# Patient Record
Sex: Male | Born: 1990 | Race: Black or African American | Hispanic: No | Marital: Single | Smoking: Former smoker
Health system: Southern US, Community
[De-identification: ages and names within clinical notes are randomized; demographics above are authoritative.]

## PROBLEM LIST (undated history)

## (undated) DIAGNOSIS — L309 Dermatitis, unspecified: Secondary | ICD-10-CM

## (undated) DIAGNOSIS — J45909 Unspecified asthma, uncomplicated: Secondary | ICD-10-CM

## (undated) HISTORY — DX: Dermatitis, unspecified: L30.9

## (undated) HISTORY — PX: FINGER SURGERY: SHX640

---

## 2010-08-22 ENCOUNTER — Emergency Department (HOSPITAL_COMMUNITY)
Admission: EM | Admit: 2010-08-22 | Discharge: 2010-08-23 | Payer: Self-pay | Source: Home / Self Care | Admitting: Emergency Medicine

## 2010-08-23 ENCOUNTER — Emergency Department (HOSPITAL_COMMUNITY)
Admission: EM | Admit: 2010-08-23 | Discharge: 2010-08-24 | Payer: Self-pay | Source: Home / Self Care | Admitting: Emergency Medicine

## 2011-08-21 ENCOUNTER — Emergency Department (HOSPITAL_COMMUNITY)
Admission: EM | Admit: 2011-08-21 | Discharge: 2011-08-21 | Disposition: A | Discharge status: Home / Self Care | Payer: Self-pay | Attending: Emergency Medicine | Admitting: Emergency Medicine

## 2011-08-21 DIAGNOSIS — Z0389 Encounter for observation for other suspected diseases and conditions ruled out: Secondary | ICD-10-CM | POA: Insufficient documentation

## 2011-08-22 ENCOUNTER — Emergency Department (HOSPITAL_COMMUNITY): Payer: Self-pay

## 2011-08-22 ENCOUNTER — Emergency Department (HOSPITAL_COMMUNITY)
Admission: EM | Admit: 2011-08-22 | Discharge: 2011-08-22 | Disposition: A | Discharge status: Home / Self Care | Payer: Self-pay | Attending: Emergency Medicine | Admitting: Emergency Medicine

## 2011-08-22 DIAGNOSIS — IMO0002 Reserved for concepts with insufficient information to code with codable children: Secondary | ICD-10-CM | POA: Insufficient documentation

## 2011-08-22 DIAGNOSIS — S91109A Unspecified open wound of unspecified toe(s) without damage to nail, initial encounter: Secondary | ICD-10-CM | POA: Insufficient documentation

## 2011-08-22 DIAGNOSIS — J45909 Unspecified asthma, uncomplicated: Secondary | ICD-10-CM | POA: Insufficient documentation

## 2011-08-22 DIAGNOSIS — M79609 Pain in unspecified limb: Secondary | ICD-10-CM | POA: Insufficient documentation

## 2011-08-30 ENCOUNTER — Emergency Department (HOSPITAL_COMMUNITY)
Admission: EM | Admit: 2011-08-30 | Discharge: 2011-08-30 | Disposition: A | Discharge status: Home / Self Care | Payer: Self-pay | Attending: Emergency Medicine | Admitting: Emergency Medicine

## 2011-08-30 DIAGNOSIS — S91109A Unspecified open wound of unspecified toe(s) without damage to nail, initial encounter: Secondary | ICD-10-CM | POA: Insufficient documentation

## 2011-08-30 DIAGNOSIS — J45909 Unspecified asthma, uncomplicated: Secondary | ICD-10-CM | POA: Insufficient documentation

## 2011-08-30 DIAGNOSIS — M7989 Other specified soft tissue disorders: Secondary | ICD-10-CM | POA: Insufficient documentation

## 2011-08-30 DIAGNOSIS — Y849 Medical procedure, unspecified as the cause of abnormal reaction of the patient, or of later complication, without mention of misadventure at the time of the procedure: Secondary | ICD-10-CM | POA: Insufficient documentation

## 2011-08-30 DIAGNOSIS — T8131XA Disruption of external operation (surgical) wound, not elsewhere classified, initial encounter: Secondary | ICD-10-CM | POA: Insufficient documentation

## 2011-08-30 DIAGNOSIS — Z4802 Encounter for removal of sutures: Secondary | ICD-10-CM | POA: Insufficient documentation

## 2011-08-30 DIAGNOSIS — IMO0002 Reserved for concepts with insufficient information to code with codable children: Secondary | ICD-10-CM | POA: Insufficient documentation

## 2013-02-18 ENCOUNTER — Emergency Department (HOSPITAL_COMMUNITY)
Admission: EM | Admit: 2013-02-18 | Discharge: 2013-02-18 | Disposition: A | Discharge status: Home / Self Care | Payer: Self-pay | Attending: Emergency Medicine | Admitting: Emergency Medicine

## 2013-02-18 ENCOUNTER — Emergency Department (HOSPITAL_COMMUNITY): Payer: Self-pay

## 2013-02-18 ENCOUNTER — Encounter (HOSPITAL_COMMUNITY): Payer: Self-pay | Admitting: Nurse Practitioner

## 2013-02-18 DIAGNOSIS — R0602 Shortness of breath: Secondary | ICD-10-CM | POA: Insufficient documentation

## 2013-02-18 DIAGNOSIS — R0989 Other specified symptoms and signs involving the circulatory and respiratory systems: Secondary | ICD-10-CM | POA: Insufficient documentation

## 2013-02-18 DIAGNOSIS — Z79899 Other long term (current) drug therapy: Secondary | ICD-10-CM | POA: Insufficient documentation

## 2013-02-18 DIAGNOSIS — F17200 Nicotine dependence, unspecified, uncomplicated: Secondary | ICD-10-CM | POA: Insufficient documentation

## 2013-02-18 DIAGNOSIS — J45901 Unspecified asthma with (acute) exacerbation: Secondary | ICD-10-CM | POA: Insufficient documentation

## 2013-02-18 DIAGNOSIS — R0609 Other forms of dyspnea: Secondary | ICD-10-CM | POA: Insufficient documentation

## 2013-02-18 HISTORY — DX: Unspecified asthma, uncomplicated: J45.909

## 2013-02-18 LAB — BASIC METABOLIC PANEL
CO2: 26 mEq/L (ref 19–32)
GFR calc non Af Amer: >90 mL/min (ref >90)
Glucose, Bld: 72 mg/dL (ref 70–99)
Potassium: 3.5 mEq/L (ref 3.5–5.1)
Sodium: 139 mEq/L (ref 135–145)

## 2013-02-18 LAB — CBC
Hemoglobin: 14.3 g/dL (ref 13.0–17.0)
RBC: 4.88 MIL/uL (ref 4.22–5.81)

## 2013-02-18 LAB — POCT I-STAT TROPONIN I

## 2013-02-18 MED ORDER — ALBUTEROL SULFATE HFA 108 (90 BASE) MCG/ACT IN AERS
2.0000 | INHALATION_SPRAY | Freq: Once | RESPIRATORY_TRACT | Status: AC
  Administered 2013-02-18: 2 via RESPIRATORY_TRACT
  Filled 2013-02-18: qty 6.7

## 2013-02-18 MED ORDER — PREDNISONE 20 MG PO TABS: 40.0000 mg | ORAL_TABLET | Freq: Every day | ORAL | Status: DC

## 2013-02-18 NOTE — ED Notes (Signed)
Pt presents to department for evaluation of chest pain. States ongoing x2 weeks. Also states asthma issues and upper respiratory cold with yellow mucus. States he has been wheezing at home. Denies pain at the time. Lung sounds clear and equal bilaterally. Respirations unlabored. Speaking complete sentences. Pt is alert and oriented x4. Skin warm and dry.

## 2013-02-18 NOTE — ED Provider Notes (Signed)
History     CSN: [id number]  Arrival date & time 02/18/13  1238   First MD Initiated Contact with Patient 02/18/13 1425      Chief Complaint  Patient presents with  . Chest Pain    (Consider location/radiation/quality/duration/timing/severity/associated sxs/prior treatment) HPI Comments: 22-year-old male with a history of asthma who presents for chest tightness that has been coming more frequent over the last 2 weeks. Patient states episodes of chest tightness last a few minutes before spontaneously resolving. Patient states symptoms are worse with activity/movement and relieved with rest. Patient denies fever, cough, chest pain, syncope, N/V/D, abdominal pain, weakness, and numbness or tingling in his extremities.  The history is provided by the patient. No language interpreter was used.    Past Medical History  Diagnosis Date  . Asthma     No past surgical history on file.  History reviewed. No pertinent family history.  History  Substance Use Topics  . Smoking status: Current Every Day Smoker  . Smokeless tobacco: Not on file  . Alcohol Use: No      Review of Systems  Constitutional: Negative for fever.  Eyes: Negative for visual disturbance.  Respiratory: Positive for chest tightness and shortness of breath. Negative for cough.   Cardiovascular: Negative for chest pain and palpitations.  Gastrointestinal: Negative for nausea, vomiting, abdominal pain and diarrhea.  Neurological: Negative for syncope, weakness and numbness.  All other systems reviewed and are negative.    Allergies  Review of patient's allergies indicates no known allergies.  Home Medications   Current Outpatient Rx  Name  Route  Sig  Dispense  Refill  . diphenhydrAMINE (BENADRYL) 25 mg capsule   Oral   Take 25 mg by mouth every 6 (six) hours as needed for itching.         . fexofenadine (ALLEGRA) 180 MG tablet   Oral   Take 180 mg by mouth daily.         . Multiple Vitamin  (MULTIVITAMIN WITH MINERALS) TABS   Oral   Take 1 tablet by mouth daily.         . Pseudoephedrine-Acetaminophen (ALKA-SELTZER PLUS COLD/SINUS PO)   Oral   Take 2 capsules by mouth every 4 (four) hours as needed (for cold symptoms).         . predniSONE (DELTASONE) 20 MG tablet   Oral   Take 2 tablets (40 mg total) by mouth daily.   10 tablet   0     BP 121/56  Pulse 53  Temp(Src) 98.4 F (36.9 C) (Oral)  Resp 19  SpO2 97%  Physical Exam  Nursing note and vitals reviewed. Constitutional: He is oriented to person, place, and time. He appears well-developed and well-nourished. No distress.  HENT:  Head: Normocephalic and atraumatic.  Mouth/Throat: Oropharynx is clear and moist. No oropharyngeal exudate.  Eyes: Conjunctivae and EOM are normal. Pupils are equal, round, and reactive to light. No scleral icterus.  Neck: Normal range of motion. Neck supple.  Cardiovascular: Normal rate, regular rhythm, normal heart sounds and intact distal pulses.   Pulmonary/Chest: Effort normal and breath sounds normal. No respiratory distress. He has no wheezes. He has no rales.  Abdominal: Soft. He exhibits no distension. There is no tenderness.  Musculoskeletal: Normal range of motion.  Neurological: He is alert and oriented to person, place, and time.  Skin: Skin is warm and dry. No rash noted. He is not diaphoretic. No erythema. No pallor.  Psychiatric: He has a  normal mood and affect. His behavior is normal.    ED Course  Procedures (including critical care time)  Labs Reviewed  CBC  BASIC METABOLIC PANEL  POCT I-STAT TROPONIN I   Dg Chest 2 View  02/18/2013  *RADIOLOGY REPORT*  Clinical Data: Chest pain and cough; wheezing  CHEST - 2 VIEW  Comparison: None.  Findings:  The lungs are clear.  The heart size and pulmonary vascularity are normal.  No adenopathy.  No bone lesions.  No pneumothorax.  IMPRESSION: No abnormality noted.   Original Report Authenticated By: Bretta Bang, M.D.     Date: 02/18/2013  Rate: 65  Rhythm: normal sinus rhythm  QRS Axis: normal  Intervals: normal  ST/T Wave abnormalities: nonspecific ST changes and early repolarization  Conduction Disutrbances:none  Narrative Interpretation: NSR with ST changes consistent with early repolarization; no STEMI  Old EKG Reviewed: none available   1. Asthma exacerbation     MDM  Patient with asthma hx presents for chest tightness and dyspnea that lasts a few minutes before spontaneously resolving; episodes are increasing in frequency and improved with rest. On exam, heart RRR, lungs CTAB, and abdomen without TTP or peritoneal signs; patient well and nontoxic appearing and in no acute distress. He is not hypoxic, tachycardic or tachypneic. Patient states he has not required tx for asthma in "years". Have expressed that symptoms consistent with asthma exacerbation. Labs without significant findings and EKG without STEMI or T wave inversion. Patient stable for d/c with PCP follow up as well as 5 day course of prednisone and albuterol inhaler. Indications for ED return discussed. Patient states comfort and understanding with this d/c plan with no unaddressed concerns. Patient work up and management discussed with Dr. Adriana Simas who is in agreement.        Antony Madura, PA-C 02/19/13 [phone]

## 2013-02-18 NOTE — ED Notes (Signed)
C/o cp for 2 weeks, "feels like my chest is going to explode every time I breathe." reports history of asthma many years ago "but this feels different." has no needed inhalers for many years. A&Ox4, resp e/u

## 2013-02-18 NOTE — ED Notes (Signed)
Lab called, to re-draw ISTAT troponin, PA aware. Vital signs stable. No signs of acute distress noted. Friend at the bedside.

## 2013-02-20 NOTE — ED Provider Notes (Signed)
Medical screening examination/treatment/procedure(s) were conducted as a shared visit with non-physician practitioner(s) and myself.  I personally evaluated the patient during the encounter.  Asthma exacerbation.  Good oxygenation. Home with outpatient management  Donnetta Hutching, MD 02/20/13 1004

## 2013-06-16 ENCOUNTER — Emergency Department (HOSPITAL_COMMUNITY)
Admission: EM | Admit: 2013-06-16 | Discharge: 2013-06-16 | Disposition: A | Discharge status: Home / Self Care | Payer: Self-pay | Attending: Emergency Medicine | Admitting: Emergency Medicine

## 2013-06-16 ENCOUNTER — Encounter (HOSPITAL_COMMUNITY): Payer: Self-pay | Admitting: *Deleted

## 2013-06-16 DIAGNOSIS — J45901 Unspecified asthma with (acute) exacerbation: Secondary | ICD-10-CM | POA: Insufficient documentation

## 2013-06-16 DIAGNOSIS — F17200 Nicotine dependence, unspecified, uncomplicated: Secondary | ICD-10-CM | POA: Insufficient documentation

## 2013-06-16 MED ORDER — IPRATROPIUM BROMIDE 0.02 % IN SOLN
0.5000 mg | Freq: Once | RESPIRATORY_TRACT | Status: AC
  Administered 2013-06-16: 0.5 mg via RESPIRATORY_TRACT
  Filled 2013-06-16: qty 2.5

## 2013-06-16 MED ORDER — ALBUTEROL SULFATE (5 MG/ML) 0.5% IN NEBU
5.0000 mg | INHALATION_SOLUTION | Freq: Once | RESPIRATORY_TRACT | Status: AC
  Administered 2013-06-16: 5 mg via RESPIRATORY_TRACT

## 2013-06-16 MED ORDER — PREDNISONE 20 MG PO TABS: 40.0000 mg | ORAL_TABLET | Freq: Every day | ORAL | Status: DC

## 2013-06-16 MED ORDER — ALBUTEROL SULFATE (5 MG/ML) 0.5% IN NEBU
5.0000 mg | INHALATION_SOLUTION | Freq: Once | RESPIRATORY_TRACT | Status: AC
  Administered 2013-06-16: 5 mg via RESPIRATORY_TRACT
  Filled 2013-06-16: qty 1

## 2013-06-16 MED ORDER — PREDNISONE 20 MG PO TABS
60.0000 mg | ORAL_TABLET | Freq: Once | ORAL | Status: AC
  Administered 2013-06-16: 60 mg via ORAL
  Filled 2013-06-16: qty 3

## 2013-06-16 MED ORDER — IPRATROPIUM BROMIDE 0.02 % IN SOLN
0.5000 mg | Freq: Once | RESPIRATORY_TRACT | Status: AC
  Administered 2013-06-16: 0.5 mg via RESPIRATORY_TRACT

## 2013-06-16 MED ORDER — ALBUTEROL SULFATE (5 MG/ML) 0.5% IN NEBU
INHALATION_SOLUTION | RESPIRATORY_TRACT | Status: AC
  Filled 2013-06-16: qty 1

## 2013-06-16 MED ORDER — ALBUTEROL SULFATE HFA 108 (90 BASE) MCG/ACT IN AERS
2.0000 | INHALATION_SPRAY | RESPIRATORY_TRACT | Status: DC | PRN
  Administered 2013-06-16: 2 via RESPIRATORY_TRACT
  Filled 2013-06-16 (×2): qty 6.7

## 2013-06-16 NOTE — ED Provider Notes (Signed)
CSN: [id number]     Arrival date & time 06/16/13  1558 History     First MD Initiated Contact with Patient 06/16/13 1621     Chief Complaint  Patient presents with  . Asthma   (Consider location/radiation/quality/duration/timing/severity/associated sxs/prior Treatment) Patient is a 22 y.o. male presenting with asthma.  Asthma Associated symptoms include coughing. Pertinent negatives include no abdominal pain, chest pain, chills, congestion, diaphoresis, fatigue, fever, headaches, nausea, sore throat, vomiting or weakness.   Davione Lenker is a 22 year old male with PMH of Asthma who presents today for SOB.  He reports that he was playing basketball yesterday with friend and afterworld he felt chest tightness and felt SOB.  This has continued until today, he does not have a PCP at this time and does not have an albuterol rescue inhaler. When his shortness of breath still did not improve he decided to come to the ED for treatment. He reports that he was diagnosed with Asthma as a child.  He reports multiple ER visits and treatment for asthma exacerbations as a child but only one admission when he was 22 years old.  He reports that he has never required intubation.  As he got older he figured he was no longer asthmatic however in April of this year he had a mild asthma exacerbation for which he was treated in the Emergency department and given a 5 day course of prednisone.  He reports since that time he has by symptom free but has not yet established with a PCP.  He reports that he recently started a job and is waiting for insurance card to come to establish with a new PCP.   Past Medical History  Diagnosis Date  . Asthma    History reviewed. No pertinent past surgical history. No family history on file. History  Substance Use Topics  . Smoking status: Current Every Day Smoker  . Smokeless tobacco: Not on file  . Alcohol Use: No    Review of Systems  Constitutional: Negative for fever,  chills, diaphoresis, activity change, appetite change and fatigue.  HENT: Negative for congestion, sore throat, rhinorrhea, sneezing, postnasal drip and sinus pressure.   Eyes: Negative for visual disturbance.  Respiratory: Positive for cough, chest tightness, shortness of breath and wheezing. Negative for stridor.   Cardiovascular: Negative for chest pain, palpitations and leg swelling.  Gastrointestinal: Negative for nausea, vomiting, abdominal pain, diarrhea, constipation and abdominal distention.  Endocrine: Negative for polydipsia and polyuria.  Genitourinary: Negative for dysuria, frequency and difficulty urinating.  Skin: Negative for wound.  Allergic/Immunologic: Negative for food allergies.  Neurological: Negative for dizziness, weakness and headaches.  Psychiatric/Behavioral: Negative for confusion.    Allergies  Other  Home Medications   Current Outpatient Rx  Name  Route  Sig  Dispense  Refill  . diphenhydrAMINE (BENADRYL) 25 mg capsule   Oral   Take 25 mg by mouth every 6 (six) hours as needed for itching.         . fexofenadine (ALLEGRA) 180 MG tablet   Oral   Take 180 mg by mouth daily.         . Multiple Vitamin (MULTIVITAMIN WITH MINERALS) TABS   Oral   Take 1 tablet by mouth daily.         . predniSONE (DELTASONE) 20 MG tablet   Oral   Take 2 tablets (40 mg total) by mouth daily.   10 tablet   0   . Pseudoephedrine-Acetaminophen (ALKA-SELTZER PLUS COLD/SINUS  PO)   Oral   Take 2 capsules by mouth every 4 (four) hours as needed (for cold symptoms).          BP 138/90  Pulse 79  Temp(Src) 98.1 F (36.7 C) (Oral)  Resp 20  SpO2 97% Physical Exam  Nursing note and vitals reviewed. Constitutional: He is oriented to person, place, and time. He appears well-developed and well-nourished. He appears distressed (mild).  HENT:  Head: Normocephalic and atraumatic.  Eyes: Conjunctivae and EOM are normal.  Cardiovascular: Normal rate, regular rhythm  and normal heart sounds.   No murmur heard. Pulmonary/Chest: He has wheezes.  No accessory muscle use, evaluated after first dose of albuterol.  Abdominal: Soft. Bowel sounds are normal. There is no tenderness.  Neurological: He is alert and oriented to person, place, and time.  Skin: Skin is warm. He is not diaphoretic.  Psychiatric: He has a normal mood and affect. His behavior is normal. Judgment normal.    ED Course   Procedures (including critical care time)  Labs Reviewed - No data to display No results found. 1. Mild asthma exacerbation     MDM  22 year old male with PMH of Asthma who presents for a mild asthma exacerbation.  He is speaking in full sentences, his repiratory rate in mildly increased, no accessory muscle use, and is HR is less than 100.  He has been given one dose of nebulizer albuterol with some improvement, however he is still wheezing.  Will give a second neb treatment with atrovent, patient still had wheezing but feels better.  A third albuterol and atrovent treatment was given.  Patient reported feeling much better.  Mild wheezing was still heard on exam but patient was ambulated with O2 sat remained at 96% with HR from 100-115. Albuterol rescue inhaler ordered to bedside and patient instructed on use.  Patient was also given a peak flow meter with instructions for use.   Patient will be discharged with a 5 day course of prednisone and with albuterol inhaler.  Importance of establishing with PCP was reiterated and patient was given local resource guide.  Carlynn Purl, DO 06/16/13 [phone]

## 2013-06-16 NOTE — Progress Notes (Signed)
Peak flows 350 post tx's. MDI and spacer given to patient with peak flow, pt demonstrates knowledge of use and has no questions for RT at this time. RT will continue to monitor.

## 2013-06-16 NOTE — ED Provider Notes (Signed)
I saw and evaluated the patient, reviewed the resident's note and I agree with the findings and plan. Pt with typical asthma exacerbation  symptoms.  No infectious sx, productive cough or other complaints.  Wheezing on exam.  will give steroids, albuterol/atrovent and recheck. Pt improved with therapy but still some wheezing.  Normal O2 sats.  Gwyneth Sprout, MD 06/16/13 [phone]

## 2013-06-16 NOTE — ED Notes (Signed)
Pt discharged.Vital signs stable and GCS 15 

## 2013-06-16 NOTE — ED Notes (Signed)
Pt has wheezing and suffers from asthma

## 2014-02-03 ENCOUNTER — Emergency Department (HOSPITAL_COMMUNITY): Payer: BC Managed Care – PPO

## 2014-02-03 ENCOUNTER — Emergency Department (HOSPITAL_COMMUNITY)
Admission: EM | Admit: 2014-02-03 | Discharge: 2014-02-03 | Disposition: A | Discharge status: Home / Self Care | Payer: BC Managed Care – PPO | Attending: Emergency Medicine | Admitting: Emergency Medicine

## 2014-02-03 DIAGNOSIS — J45901 Unspecified asthma with (acute) exacerbation: Secondary | ICD-10-CM

## 2014-02-03 DIAGNOSIS — F17200 Nicotine dependence, unspecified, uncomplicated: Secondary | ICD-10-CM | POA: Insufficient documentation

## 2014-02-03 DIAGNOSIS — IMO0002 Reserved for concepts with insufficient information to code with codable children: Secondary | ICD-10-CM | POA: Insufficient documentation

## 2014-02-03 DIAGNOSIS — Z79899 Other long term (current) drug therapy: Secondary | ICD-10-CM | POA: Insufficient documentation

## 2014-02-03 MED ORDER — PREDNISONE 20 MG PO TABS: 40.0000 mg | ORAL_TABLET | Freq: Every day | ORAL | Status: DC

## 2014-02-03 MED ORDER — ALBUTEROL SULFATE HFA 108 (90 BASE) MCG/ACT IN AERS
2.0000 | INHALATION_SPRAY | Freq: Once | RESPIRATORY_TRACT | Status: AC
  Administered 2014-02-03: 2 via RESPIRATORY_TRACT
  Filled 2014-02-03: qty 6.7

## 2014-02-03 MED ORDER — ALBUTEROL SULFATE (2.5 MG/3ML) 0.083% IN NEBU
5.0000 mg | INHALATION_SOLUTION | Freq: Once | RESPIRATORY_TRACT | Status: AC
  Administered 2014-02-03: 5 mg via RESPIRATORY_TRACT
  Filled 2014-02-03: qty 6

## 2014-02-03 MED ORDER — CETIRIZINE HCL 10 MG PO TABS: 10.0000 mg | ORAL_TABLET | Freq: Every day | ORAL | Status: DC

## 2014-02-03 NOTE — Progress Notes (Signed)
[facility] provided pt with a list of primary care resources. Patient stated that he had insurance.

## 2014-02-03 NOTE — ED Notes (Signed)
Pt reports hx of asthma, asthma attack x2 days ago. 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 Pt reports chest pain upon inspiration and coughing. Pain 8/10. Wheezing heard in all lung fields. Pt does not have inhaler at this time.

## 2014-02-03 NOTE — ED Provider Notes (Signed)
CSN: [id number]     Arrival date & time 02/03/14  1415 History   First MD Initiated Contact with Patient 02/03/14 1517     Chief Complaint  Patient presents with  . Asthma     (Consider location/radiation/quality/duration/timing/severity/associated sxs/prior Treatment) HPI  Dory PeruFrank Christensen 23 year old male with a past medical history of asthma and allergies.  The patient presents to the emergency room with chief complaint of asthma exacerbation.  Patient states that his asthma has been improving throughout the years of his life especially with sports activities.  He states occasionally after strenuous gains he will have some wheezing and bronchospasm.  He also states that his asthma tends to get worse around seasonal allergy times.  He should states that over the past few days he's had worsening asthma.  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  He states that he also has a bronchitic type symptoms including pain with cough and some intermittent rhinorrhea.  The patient ran out of his albuterol inhaler at home causing him to come in today for wheezing and shortness of breath.  The patient was evaluated previously by the respiratory therapist he states that he had diffuse inspiratory and expiratory wheezing throughout lung fields.  On my examination the patient had a heart he had 5 mg albuterol neb treatment. Past Medical History  Diagnosis Date  . Asthma    No past surgical history on file. No family history on file. History  Substance Use Topics  . Smoking status: Current Every Day Smoker  . Smokeless tobacco: Not on file  . Alcohol Use: No    Review of Systems  Ten systems reviewed and are negative for acute change, except as noted in the HPI.     Allergies  Other  Home Medications   Current Outpatient Rx  Name  Route  Sig  Dispense  Refill  . albuterol (PROVENTIL HFA;VENTOLIN HFA) 108 (90 BASE) MCG/ACT inhaler   Inhalation  Inhale 1-2 puffs into the lungs every 6 (six) hours as needed for wheezing or shortness of breath.         . cetirizine (ZYRTEC) 10 MG tablet   Oral   Take 10 mg by mouth daily.         . diphenhydrAMINE (BENADRYL) 25 MG tablet   Oral   Take 50 mg by mouth daily as needed for allergies.         . fluticasone (FLOVENT HFA) 110 MCG/ACT inhaler   Inhalation   Inhale 2 puffs into the lungs 2 (two) times daily.          BP 138/81  Pulse 56  Temp(Src) 98.5 F (36.9 C) (Oral)  Resp 16  SpO2 98% Physical Exam Physical Exam  Nursing note and vitals reviewed. Constitutional: He appears well-developed and well-nourished. No distress.  HENT:  Head: Normocephalic and atraumatic.  Eyes: Conjunctivae normal are normal. No scleral icterus.  Neck: Normal range of motion. Neck supple.  Cardiovascular: Normal rate, regular rhythm and normal heart sounds.   Pulmonary/Chest: Patient speaking in full sentences.Effort normal and breath sounds normal. No respiratory distress.  Abdominal: Soft. There is no tenderness.  Musculoskeletal: He exhibits no edema.  Neurological: He is alert.  Skin: Skin is warm and dry. He is not diaphoretic.  Psychiatric: His behavior is normal.    ED Course  Procedures (including critical care time) Labs Review Labs Reviewed  BASIC METABOLIC PANEL  CBC   Imaging Review  Dg Chest 2 View (if Patient Has Fever And/or Copd)  02/03/2014   CLINICAL DATA:  Chest pain and cough  EXAM: CHEST  2 VIEW  COMPARISON:  02/18/2013  FINDINGS: The heart size and mediastinal contours are within normal limits. Both lungs are clear. The visualized skeletal structures are unremarkable.  IMPRESSION: No active cardiopulmonary disease.   Electronically Signed   By: Alcide Clever M.D.   On: 02/03/2014 15:31     EKG Interpretation None      MDM   Final diagnoses:  Asthma exacerbation   cxr negative.I personally reviewed the images using our PACS system.  Patient given  albuterol nebulizer treatment with great improvement.  No no wheezing and auscultation.  Patient speaking in full sentences and states he is feeling much better.  During the patient albuterol rescue inhaler here in the ED.  Start him on a short course of prednisone to decrease his asthma exacerbation recurrence rate.  The patient will also be prescribed Zyrtec 1 daily and referral to primary care through our resource guide.  Safe for discharge at this time.    Arthor Captain, PA-C 02/03/14 1609

## 2014-02-03 NOTE — ED Notes (Signed)
Patient transported to X-ray 

## 2014-02-03 NOTE — Discharge Instructions (Signed)
Asthma, Adult °Asthma is a recurring condition in which the airways tighten and narrow. Asthma can make it difficult to breathe. It can cause coughing, wheezing, and shortness of breath. Asthma episodes (also called asthma attacks) range from minor to life-threatening. Asthma cannot be cured, but medicines and lifestyle changes can help control it. °CAUSES °Asthma is believed to be caused by inherited (genetic) and environmental factors, but its exact cause is unknown. Asthma may be triggered by allergens, lung infections, or irritants in the air. Asthma triggers are different for each person. Common triggers include:  °· Animal dander. °· Dust mites. °· Cockroaches. °· Pollen from trees or grass. °· Mold. °· Smoke. °· Air pollutants such as dust, household cleaners, hair sprays, aerosol sprays, paint fumes, strong chemicals, or strong odors. °· Cold air, weather changes, and winds (which increase molds and pollens in the air). °· Strong emotional expressions such as crying or laughing hard. °· Stress. °· Certain medicines (such as aspirin) or types of drugs (such as beta-blockers). °· Sulfites in foods and drinks. Foods and drinks that may contain sulfites include dried fruit, potato chips, and sparkling grape juice. °· Infections or inflammatory conditions such as the flu, a cold, or an inflammation of the nasal membranes (rhinitis). °· Gastroesophageal reflux disease (GERD). °· Exercise or strenuous activity. °SYMPTOMS °Symptoms may occur immediately after asthma is triggered or many hours later. Symptoms include: °· Wheezing. °· Excessive nighttime or early morning coughing. °· Frequent or severe coughing with a common cold. °· Chest tightness. °· Shortness of breath. °DIAGNOSIS  °The diagnosis of asthma is made by a review of your medical history and a physical exam. Tests may also be performed. These may include: °· Lung function studies. These tests show how much air you breath in and out. °· Allergy  tests. °· Imaging tests such as X-rays. °TREATMENT  °Asthma cannot be cured, but it can usually be controlled. Treatment involves identifying and avoiding your asthma triggers. It also involves medicines. There are 2 classes of medicine used for asthma treatment:  °· Controller medicines. These prevent asthma symptoms from occurring. They are usually taken every day. °· Reliever or rescue medicines. These quickly relieve asthma symptoms. They are used as needed and provide short-term relief. °Your health care provider will help you create an asthma action plan. An asthma action plan is a written plan for managing and treating your asthma attacks. It includes a list of your asthma triggers and how they may be avoided. It also includes information on when medicines should be taken and when their dosage should be changed. An action plan may also involve the use of a device called a peak flow meter. A peak flow meter measures how well the lungs are working. It helps you monitor your condition. °HOME CARE INSTRUCTIONS  °· Take medicine as directed by your health care provider. Speak with your health care provider if you have questions about how or when to take the medicines. °· Use a peak flow meter as directed by your health care provider. Record and keep track of readings. °· Understand and use the action plan to help minimize or stop an asthma attack without needing to seek medical care. °· Control your home environment in the following ways to help prevent asthma attacks: °· Do not smoke. Avoid being exposed to secondhand smoke. °· Change your heating and air conditioning filter regularly. °· Limit your use of fireplaces and wood stoves. °· Get rid of pests (such as roaches and   mice) and their droppings.  Throw away plants if you see mold on them.  Clean your floors and dust regularly. Use unscented cleaning products.  Try to have someone else vacuum for you regularly. Stay out of rooms while they are being  vacuumed and for a short while afterward. If you vacuum, use a dust mask from a hardware store, a double-layered or microfilter vacuum cleaner bag, or a vacuum cleaner with a HEPA filter.  Replace carpet with wood, tile, or vinyl flooring. Carpet can trap dander and dust.  Use allergy-proof pillows, mattress covers, and box spring covers.  Wash bed sheets and blankets every week in hot water and dry them in a dryer.  Use blankets that are made of polyester or cotton.  Clean bathrooms and kitchens with bleach. If possible, have someone repaint the walls in these rooms with mold-resistant paint. Keep out of the rooms that are being cleaned and painted.  Wash hands frequently. SEEK MEDICAL CARE IF:   You have wheezing, shortness of breath, or a cough even if taking medicine to prevent attacks.  The colored mucus you cough up (sputum) is thicker than usual.  Your sputum changes from clear or white to yellow, green, gray, or bloody.  You have any problems that may be related to the medicines you are taking (such as a rash, itching, swelling, or trouble breathing).  You are using a reliever medicine more than 2 3 times per week.  Your peak flow is still at 50 79% of you personal best after following your action plan for 1 hour. SEEK IMMEDIATE MEDICAL CARE IF:   You seem to be getting worse and are unresponsive to treatment during an asthma attack.  You are short of breath even at rest.  You get short of breath when doing very little physical activity.  You have difficulty eating, drinking, or talking due to asthma symptoms.  You develop chest pain.  You develop a fast heartbeat.  You have a bluish color to your lips or fingernails.  You are lightheaded, dizzy, or faint.  Your peak flow is less than 50% of your personal best.  You have a fever or persistent symptoms for more than 2 3 days.  You have a fever and symptoms suddenly get worse. MAKE SURE YOU:   Understand these  instructions.  Will watch your condition.  Will get help right away if you are not doing well or get worse. Document Released: 10/16/2005 Document Revised: 06/18/2013 Document Reviewed: 05/15/2013 [facility] Patient Information 2014 [place]. Smoking Cessation Quitting smoking is important to your health and has many advantages. However, it is not always easy to quit since nicotine is a very addictive drug. Often times, people try 3 times or more before being able to quit. This document explains the best ways for you to prepare to quit smoking. Quitting takes hard work and a lot of effort, but you can do it. ADVANTAGES OF QUITTING SMOKING  You will live longer, feel better, and live better.  Your body will feel the impact of quitting smoking almost immediately.  Within 20 minutes, blood pressure decreases. Your pulse returns to its normal level.  After 8 hours, carbon monoxide levels in the blood return to normal. Your oxygen level increases.  After 24 hours, the chance of having a heart attack starts to decrease. Your breath, hair, and body stop smelling like smoke.  After 48 hours, damaged nerve endings begin to recover. Your sense of taste and smell improve.  After 72 hours, the body is virtually free of nicotine. Your bronchial tubes relax and breathing becomes easier.  After 2 to 12 weeks, lungs can hold more air. Exercise becomes easier and circulation improves.  The risk of having a heart attack, stroke, cancer, or lung disease is greatly reduced.  After 1 year, the risk of coronary heart disease is cut in half.  After 5 years, the risk of stroke falls to the same as a nonsmoker.  After 10 years, the risk of lung cancer is cut in half and the risk of other cancers decreases significantly.  After 15 years, the risk of coronary heart disease drops, usually to the level of a nonsmoker.  If you are pregnant, quitting smoking will improve your chances of having a healthy  baby.  The people you live with, especially any children, will be healthier.  You will have extra money to spend on things other than cigarettes. QUESTIONS TO THINK ABOUT BEFORE ATTEMPTING TO QUIT You may want to talk about your answers with your caregiver.  Why do you want to quit?  If you tried to quit in the past, what helped and what did not?  What will be the most difficult situations for you after you quit? How will you plan to handle them?  Who can help you through the tough times? Your family? Friends? A caregiver?  What pleasures do you get from smoking? What ways can you still get pleasure if you quit? Here are some questions to ask your caregiver:  How can you help me to be successful at quitting?  What medicine do you think would be best for me and how should I take it?  What should I do if I need more help?  What is smoking withdrawal like? How can I get information on withdrawal? GET READY  Set a quit date.  Change your environment by getting rid of all cigarettes, ashtrays, matches, and lighters in your home, car, or work. Do not let people smoke in your home.  Review your past attempts to quit. Think about what worked and what did not. GET SUPPORT AND ENCOURAGEMENT You have a better chance of being successful if you have help. You can get support in many ways.  Tell your family, friends, and co-workers that you are going to quit and need their support. Ask them not to smoke around you.  Get individual, group, or telephone counseling and support. Programs are available at [employer] and health centers. Call your local health department for information about programs in your area.  Spiritual beliefs and practices may help some smokers quit.  Download a "quit meter" on your computer to keep track of quit statistics, such as how long you have gone without smoking, cigarettes not smoked, and money saved.  Get a self-help book about quitting smoking and  staying off of tobacco. LEARN NEW SKILLS AND BEHAVIORS  Distract yourself from urges to smoke. Talk to someone, go for a walk, or occupy your time with a task.  Change your normal routine. Take a different route to work. Drink tea instead of coffee. Eat breakfast in a different place.  Reduce your stress. Take a hot bath, exercise, or read a book.  Plan something enjoyable to do every day. Reward yourself for not smoking.  Explore interactive web-based programs that specialize in helping you quit. GET MEDICINE AND USE IT CORRECTLY Medicines can help you stop smoking and decrease the urge to smoke. Combining medicine with the  above behavioral methods and support can greatly increase your chances of successfully quitting smoking.  Nicotine replacement therapy helps deliver nicotine to your body without the negative effects and risks of smoking. Nicotine replacement therapy includes nicotine gum, lozenges, inhalers, nasal sprays, and skin patches. Some may be available over-the-counter and others require a prescription.  Antidepressant medicine helps people abstain from smoking, but how this works is unknown. This medicine is available by prescription.  Nicotinic receptor partial agonist medicine simulates the effect of nicotine in your brain. This medicine is available by prescription. Ask your caregiver for advice about which medicines to use and how to use them based on your health history. Your caregiver will tell you what side effects to look out for if you choose to be on a medicine or therapy. Carefully read the information on the package. Do not use any other product containing nicotine while using a nicotine replacement product.  RELAPSE OR DIFFICULT SITUATIONS Most relapses occur within the first 3 months after quitting. Do not be discouraged if you start smoking again. Remember, most people try several times before finally quitting. You may have symptoms of withdrawal because your body  is used to nicotine. You may crave cigarettes, be irritable, feel very hungry, cough often, get headaches, or have difficulty concentrating. The withdrawal symptoms are only temporary. They are strongest when you first quit, but they will go away within 10 14 days. To reduce the chances of relapse, try to:  Avoid drinking alcohol. Drinking lowers your chances of successfully quitting.  Reduce the amount of caffeine you consume. Once you quit smoking, the amount of caffeine in your body increases and can give you symptoms, such as a rapid heartbeat, sweating, and anxiety.  Avoid smokers because they can make you want to smoke.  Do not let weight gain distract you. Many smokers will gain weight when they quit, usually less than 10 pounds. Eat a healthy diet and stay active. You can always lose the weight gained after you quit.  Find ways to improve your mood other than smoking. FOR MORE INFORMATION  www.smokefree.gov  Document Released: 10/10/2001 Document Revised: 04/16/2012 Document Reviewed: 01/25/2012 [facility] Patient Information 2014 [place], [place].  Emergency Department Resource Guide 1) Find a Doctor and Pay Out of Pocket Although you won't have to find out who is covered by your insurance plan, it is a good idea to ask around and get recommendations. You will then need to call the office and see if the doctor you have chosen will accept you as a new patient and what types of options they offer for patients who are self-pay. Some doctors offer discounts or will set up payment plans for their patients who do not have insurance, but you will need to ask so you aren't surprised when you get to your appointment.  2) Contact Your Local Health Department Not all health departments have doctors that can see patients for sick visits, but many do, so it is worth a call to see if yours does. If you don't know where your local health department is, you can check in your phone book. The CDC also has  a tool to help you locate your state's health department, and many state websites also have listings of all of their local health departments.  3) Find a Walk-in Clinic If your illness is not likely to be very severe or complicated, you may want to try a walk in clinic. These are popping up all over the country in  pharmacies, drugstores, and shopping centers. They're usually staffed by nurse practitioners or physician assistants that have been trained to treat common illnesses and complaints. They're usually fairly quick and inexpensive. However, if you have serious medical issues or chronic medical problems, these are probably not your best option.  No Primary Care Doctor: - Call Health Connect at  [phone] - they can help you locate a primary care doctor that  accepts your insurance, provides certain services, etc. - Physician Referral Service- [phone]  Chronic Pain Problems: Organization         Address  Phone   Notes  [facility]  [phone] Patients need to be referred by their primary care doctor.   Medication Assistance: Organization         Address  Phone   Notes  [facility] Medication [facility] [address] [place] [place], [place] [address] [phone] --Must be a resident of [facility] -- Must have NO insurance coverage whatsoever (no Medicaid/ Medicare, etc.) -- The pt. MUST have a primary care doctor that directs their care regularly and follows them in the community   [facility]  [phone]   [employer]  [phone]    Agencies that provide inexpensive medical care: Organization         Address  Phone   Notes  [facility]  [phone]   [facility]    [phone]   [facility] [address] [place], [place] [address] [phone]   [facility] [address] [place] [phone]   [facility]    [phone]    [facility]    [phone]   [facility]  [address], [place] Phone:  [phone], Fax:  [phone] Hours of Operation:  9 am - 6 pm, M-F.  Also accepts Medicaid/Medicare and self-pay.  [facility]  [address], [place] Phone: [phone], Fax: [phone]. Hours of Operation:  8:30 am - 5:30 pm, M-F.  Also accepts Medicaid and self-pay.  [facility] [place] [address] [place] Phone: [phone]   [facility] [address] Natasha Bence [place], [place] [phone], Ext. [phone] Mondays & Thursdays: 7-9 AM.  First 15 patients are seen on a first come, first serve basis.    Medicaid-accepting [facility] Providers:  Organization         Address  Phone   Notes  [facility] [address], [place] [phone] Also accepts self-pay patients.  [facility] [address] Laurell Josephs [place], [place]  [phone]   [facility] [address], [place] [phone]   [facility] [address], [place] [phone]   Renaye Rakers [address], [place]   [phone] Only accepts [place] Access [place] patients after they have their name applied to their card.   Self-Pay (no insurance) in [facility]:  Organization         Address  Phone   Notes  Sickle Cell Patients, [facility] [address] [place], [place] [phone]   [facility] [address] [place], [place] [phone]   [facility] [place]  [address], [place] [phone]   [facility]/Dr. Osei-Bonsu  [address], [place] or [address], [place] [phone] Phone number for both [place] and [place] locations is the same.  [facility] [address], [place] [phone]   [facility] [address] [place] or [address] [phone]   [facility] [address] [place] [phone], phone; [phone], fax Sees patients 1st and 3rd Saturday of every month.  Must not qualify for public or private insurance (i.e. Medicaid, Medicare, [place] Health Choice, Veterans' Benefits)  Household income should be no more than 200% of the poverty level The clinic cannot treat you if you are pregnant or think you are pregnant  Sexually transmitted diseases are not treated at the clinic.    Dental Care: Organization         Address  Phone  Notes  [facility] Department of [facility] [address] [place], [place] [phone] Accepts children up to age 71 who are enrolled in [place] or [place] Health Choice; pregnant women with a Medicaid card; and children who have applied for Medicaid or [place] Health Choice, but were declined, whose parents can pay a reduced fee at time of service.  [facility] Department of [facility]  [address], [place] [phone] Accepts children up to age 72 who are enrolled in [place] or [place] Health Choice; pregnant women with a Medicaid card; and children who have applied for Medicaid or [place] Health Choice, but were declined, whose parents can pay a reduced fee at time of service.  [facility]  [address] [place], [place] [phone] Patients are seen by appointment only. Walk-ins are not accepted. [facility] will see patients 65 years of age and older. Monday - Tuesday (8am-5pm) Most Wednesdays (8:30-5pm) $30 per visit, cash only  [facility]  [address], [facility] [phone] Patients are seen by appointment only. Walk-ins are not accepted. [facility] will see patients 31 years of age and older. One Wednesday  Evening (Monthly: Volunteer Based).  $30 per visit, cash only  [employer]  [phone] for adults; Children under age 29, call Graduate Pediatric Dentistry at [phone]. Children aged 69-14, please call [phone] to request a pediatric application.  Dental services are provided in all areas of dental care including fillings, crowns and bridges, complete and partial dentures, implants, gum treatment, root canals, and extractions. Preventive care is also provided. Treatment is provided to both adults and children. Patients are selected via a lottery and there is often a waiting list.   [facility] [address] [place]  [phone] [email or web address]   [facility] [address] [place], [place] [phone], Ext. [phone] Second and Fourth Thursday of each month, opens at 6:30 AM; Clinic ends at 9 AM.  Patients are seen on a first-come first-served basis, and a limited number are seen during each clinic.   [facility]  [address] Ether Griffins [place], [place] [phone]   Eligibility Requirements You must have lived in [place], [place], or [place] counties for at least the last three months.   You cannot be eligible for state or federal sponsored [place], including [employer], [place], or [employer].   You generally cannot be  eligible for healthcare insurance through your employer.    How to apply: Eligibility screenings are held every Tuesday and Wednesday afternoon from 1:00 pm until 4:00 pm. You do not need an appointment for the interview!  [facility] [address] [place], [place] [phone]   [facility] Health Department  [phone]   [facility] Health Department  [phone]   [facility] Health Department  [phone]    Behavioral Health Resources in the Community: Intensive Outpatient Programs Organization         Address  Phone  Notes  [facility] [address] [place], [place] [phone]   [facility] Outpatient [address] [place], [place] [phone]   [facility] [address] [place], [place]  [phone]   [facility] [address],  [place], [place] [phone] or [phone]   Substance Abuse Resources Organization         Address  Phone  Notes  [facility]  [phone]   [facility]  [phone]   [facility] [place]  [phone]   Floydene Flock  [phone]   Residential & Outpatient Substance Abuse Program  [phone]   Psychological Services Organization         Address  Phone  Notes  [facility]  [phone]   [facility]  [phone]   [facility] [address], [place] [phone] or [phone]    Mobile Crisis Teams Organization         Address  Phone  Notes  [facility], Mobile Crisis Care Unit  [phone]   [facility]  [address] [place], [place] [phone]   Doristine Locks [address] [place] [phone]    Self-Help/Support Groups Organization         Address  Phone             Notes  [facility] [place] - variety of support groups  [phone]- [phone] Call for more information  Narcotics Anonymous (NA), [facility] [address], [employer] [place]  2 meetings at this location   Statistician         Address  Phone  Notes  [facility] [address],    [place]  [phone]   [facility]  [address] [place] [address], [place], [place] [phone]   [facility] [address] [place], [place] [phone] Admissions: 8am-3pm M-F  [facility] [address]    [place], [place] [phone]   [facility] [address] [address] [place],  [place], [place] [phone]   [facility] [address],  [place], [place] [phone]   [facility] Intensive [facility]patient [address], [place], [place] [phone]   [facility] [address]  [place], [place] [phone] or [phone]   [facility] [address] [place], [place] [phone] Accepts Medicaid  [facility] [address]  [place] [phone] Substance Abuse/Addiction Treatment   [facility]         Address  Phone  Notes  [facility]  [phone]   Angie Fava, PhD [address] [place], [place]   [phone] or [phone]   [facility]   [address]  [place], [place] [phone]   [facility] [address], [place], [place] [phone] Insurance/Medicaid/sponsorship through [employer] and Families [address] [place], [place] [phone] [place], [place] [phone]    Dr. Adele Schilder  [phone]   [facility] 1) [address]  2) [place] 3)  [address], [place] [phone]  [phone]   [place] [phone] or [phone] (After Hours)

## 2014-02-06 NOTE — ED Provider Notes (Signed)
Medical screening examination/treatment/procedure(s) were performed by non-physician practitioner and as supervising physician I was immediately available for consultation/collaboration.   EKG Interpretation None       Ryleah Miramontes, MD 02/06/14 2006 

## 2014-03-12 ENCOUNTER — Other Ambulatory Visit: Payer: Self-pay | Admitting: Internal Medicine

## 2014-03-13 ENCOUNTER — Other Ambulatory Visit: Payer: Self-pay

## 2014-03-13 ENCOUNTER — Other Ambulatory Visit: Payer: Self-pay | Admitting: Internal Medicine

## 2014-03-13 MED ORDER — ALBUTEROL SULFATE HFA 108 (90 BASE) MCG/ACT IN AERS: 1.0000 | INHALATION_SPRAY | Freq: Four times a day (QID) | RESPIRATORY_TRACT | Status: DC | PRN

## 2014-04-06 ENCOUNTER — Other Ambulatory Visit: Payer: Self-pay | Admitting: Internal Medicine

## 2014-04-07 ENCOUNTER — Other Ambulatory Visit: Payer: BC Managed Care – PPO | Admitting: Internal Medicine

## 2014-04-07 ENCOUNTER — Ambulatory Visit (INDEPENDENT_AMBULATORY_CARE_PROVIDER_SITE_OTHER): Payer: BC Managed Care – PPO | Admitting: Internal Medicine

## 2014-04-07 ENCOUNTER — Encounter: Payer: Self-pay | Admitting: Internal Medicine

## 2014-04-07 VITALS — BP 128/78 | HR 72 | Temp 98.1°F | Ht 72.0 in | Wt 154.0 lb

## 2014-04-07 DIAGNOSIS — Z13220 Encounter for screening for lipoid disorders: Secondary | ICD-10-CM

## 2014-04-07 DIAGNOSIS — J45909 Unspecified asthma, uncomplicated: Secondary | ICD-10-CM

## 2014-04-07 DIAGNOSIS — Z130 Encounter for screening for diseases of the blood and blood-forming organs and certain disorders involving the immune mechanism: Secondary | ICD-10-CM

## 2014-04-07 DIAGNOSIS — J309 Allergic rhinitis, unspecified: Secondary | ICD-10-CM

## 2014-04-07 DIAGNOSIS — Z0000 Encounter for general adult medical examination without abnormal findings: Secondary | ICD-10-CM

## 2014-04-07 LAB — CBC WITH DIFFERENTIAL/PLATELET
BASOS ABS: 0.1 10*3/uL (ref 0.0–0.1)
Basophils Relative: 1 % (ref 0–1)
Eosinophils Absolute: 0.4 10*3/uL (ref 0.0–0.7)
Eosinophils Relative: 6 % — ABNORMAL HIGH (ref 0–5)
HCT: 38.9 % — ABNORMAL LOW (ref 39.0–52.0)
HEMOGLOBIN: 13 g/dL (ref 13.0–17.0)
LYMPHS PCT: 41 % (ref 12–46)
Lymphs Abs: 2.8 10*3/uL (ref 0.7–4.0)
MCH: 28.8 pg (ref 26.0–34.0)
MCHC: 33.4 g/dL (ref 30.0–36.0)
MCV: 86.3 fL (ref 78.0–100.0)
MONOS PCT: 8 % (ref 3–12)
Monocytes Absolute: 0.6 10*3/uL (ref 0.1–1.0)
NEUTROS ABS: 3 10*3/uL (ref 1.7–7.7)
Neutrophils Relative %: 44 % (ref 43–77)
Platelets: 234 10*3/uL (ref 150–400)
RBC: 4.51 MIL/uL (ref 4.22–5.81)
RDW: 13.7 % (ref 11.5–15.5)
WBC: 6.9 10*3/uL (ref 4.0–10.5)

## 2014-04-07 LAB — COMPREHENSIVE METABOLIC PANEL
ALBUMIN: 3.9 g/dL (ref 3.5–5.2)
ALT: 16 U/L (ref 0–53)
AST: 26 U/L (ref 0–37)
Alkaline Phosphatase: 45 U/L (ref 39–117)
BUN: 14 mg/dL (ref 6–23)
CALCIUM: 9.2 mg/dL (ref 8.4–10.5)
CHLORIDE: 107 meq/L (ref 96–112)
CO2: 28 mEq/L (ref 19–32)
Creat: 1.11 mg/dL (ref 0.50–1.35)
GLUCOSE: 78 mg/dL (ref 70–99)
POTASSIUM: 4.5 meq/L (ref 3.5–5.3)
SODIUM: 140 meq/L (ref 135–145)
TOTAL PROTEIN: 5.8 g/dL — AB (ref 6.0–8.3)
Total Bilirubin: 0.7 mg/dL (ref 0.2–1.2)

## 2014-04-07 LAB — LIPID PANEL
Cholesterol: 148 mg/dL (ref 0–200)
HDL: 59 mg/dL (ref >39)
LDL Cholesterol: 80 mg/dL (ref 0–99)
Total CHOL/HDL Ratio: 2.5 Ratio
Triglycerides: 43 mg/dL (ref <150)
VLDL: 9 mg/dL (ref 0–40)

## 2014-04-07 LAB — POCT URINALYSIS DIPSTICK
BILIRUBIN UA: NEGATIVE
Blood, UA: NEGATIVE
Glucose, UA: NEGATIVE
KETONES UA: NEGATIVE
Leukocytes, UA: NEGATIVE
Nitrite, UA: NEGATIVE
PH UA: 8.5
Spec Grav, UA: <=1.005
Urobilinogen, UA: NEGATIVE

## 2014-04-07 MED ORDER — FLUTICASONE PROPIONATE HFA 110 MCG/ACT IN AERO: 2.0000 | INHALATION_SPRAY | Freq: Two times a day (BID) | RESPIRATORY_TRACT | Status: DC

## 2014-04-07 MED ORDER — ALBUTEROL SULFATE HFA 108 (90 BASE) MCG/ACT IN AERS: 1.0000 | INHALATION_SPRAY | Freq: Four times a day (QID) | RESPIRATORY_TRACT | Status: DC | PRN

## 2014-04-07 NOTE — Patient Instructions (Signed)
Use Flovent daily and albuterol as needed. Return in one year.
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
## 2014-06-25 ENCOUNTER — Other Ambulatory Visit: Payer: Self-pay

## 2014-06-25 MED ORDER — FLUTICASONE PROPIONATE HFA 110 MCG/ACT IN AERO: 2.0000 | INHALATION_SPRAY | Freq: Two times a day (BID) | RESPIRATORY_TRACT | Status: DC

## 2014-07-17 ENCOUNTER — Other Ambulatory Visit: Payer: Self-pay

## 2014-07-17 MED ORDER — ALBUTEROL SULFATE HFA 108 (90 BASE) MCG/ACT IN AERS: 1.0000 | INHALATION_SPRAY | Freq: Four times a day (QID) | RESPIRATORY_TRACT | Status: DC | PRN

## 2014-12-07 ENCOUNTER — Other Ambulatory Visit: Payer: Self-pay | Admitting: *Deleted

## 2014-12-07 MED ORDER — ALBUTEROL SULFATE HFA 108 (90 BASE) MCG/ACT IN AERS: 1.0000 | INHALATION_SPRAY | Freq: Four times a day (QID) | RESPIRATORY_TRACT | Status: DC | PRN

## 2015-03-09 ENCOUNTER — Encounter (HOSPITAL_COMMUNITY): Payer: Self-pay | Admitting: Emergency Medicine

## 2015-03-09 ENCOUNTER — Emergency Department (HOSPITAL_COMMUNITY)
Admission: EM | Admit: 2015-03-09 | Discharge: 2015-03-09 | Disposition: A | Discharge status: Home / Self Care | Payer: BLUE CROSS/BLUE SHIELD | Attending: Emergency Medicine | Admitting: Emergency Medicine

## 2015-03-09 DIAGNOSIS — N4889 Other specified disorders of penis: Secondary | ICD-10-CM | POA: Insufficient documentation

## 2015-03-09 DIAGNOSIS — Z720 Tobacco use: Secondary | ICD-10-CM | POA: Insufficient documentation

## 2015-03-09 DIAGNOSIS — Z202 Contact with and (suspected) exposure to infections with a predominantly sexual mode of transmission: Secondary | ICD-10-CM

## 2015-03-09 DIAGNOSIS — R369 Urethral discharge, unspecified: Secondary | ICD-10-CM | POA: Diagnosis not present

## 2015-03-09 DIAGNOSIS — Z7951 Long term (current) use of inhaled steroids: Secondary | ICD-10-CM | POA: Diagnosis not present

## 2015-03-09 DIAGNOSIS — Z79899 Other long term (current) drug therapy: Secondary | ICD-10-CM | POA: Diagnosis not present

## 2015-03-09 DIAGNOSIS — J45909 Unspecified asthma, uncomplicated: Secondary | ICD-10-CM | POA: Insufficient documentation

## 2015-03-09 DIAGNOSIS — Z7952 Long term (current) use of systemic steroids: Secondary | ICD-10-CM | POA: Insufficient documentation

## 2015-03-09 MED ORDER — AZITHROMYCIN 250 MG PO TABS
1000.0000 mg | ORAL_TABLET | Freq: Once | ORAL | Status: AC
  Administered 2015-03-09: 1000 mg via ORAL
  Filled 2015-03-09: qty 4

## 2015-03-09 MED ORDER — CEFTRIAXONE SODIUM 250 MG IJ SOLR
250.0000 mg | Freq: Once | INTRAMUSCULAR | Status: AC
  Administered 2015-03-09: 250 mg via INTRAMUSCULAR
  Filled 2015-03-09: qty 250

## 2015-03-09 MED ORDER — LIDOCAINE HCL 1 % IJ SOLN
INTRAMUSCULAR | Status: AC
  Administered 2015-03-09: 20 mL
  Filled 2015-03-09: qty 20

## 2015-03-09 NOTE — Discharge Instructions (Signed)
You were treated today for both gonorrhea and chlamydia. If these tests result positive, you will be contacted and are then obligated to inform your partner for treatment. °Sexually Transmitted Disease °A sexually transmitted disease (STD) is a disease or infection that may be passed (transmitted) from person to person, usually during sexual activity. This may happen by way of saliva, semen, blood, vaginal mucus, or urine. Common STDs include:  °· Gonorrhea.   °· Chlamydia.   °· Syphilis.   °· HIV and AIDS.   °· Genital herpes.   °· Hepatitis B and C.   °· Trichomonas.   °· Human papillomavirus (HPV).   °· Pubic lice.   °· Scabies. °· Mites. °· Bacterial vaginosis. °WHAT ARE CAUSES OF STDs? °An STD may be caused by bacteria, a virus, or parasites. STDs are often transmitted during sexual activity if one person is infected. However, they may also be transmitted through nonsexual means. STDs may be transmitted after:  °· Sexual intercourse with an infected person.   °· Sharing sex toys with an infected person.   °· Sharing needles with an infected person or using unclean piercing or tattoo needles. °· Having intimate contact with the genitals, mouth, or rectal areas of an infected person.   °· Exposure to infected fluids during birth. °WHAT ARE THE SIGNS AND SYMPTOMS OF STDs? °Different STDs have different symptoms. Some people may not have any symptoms. If symptoms are present, they may include:  °· Painful or bloody urination.   °· Pain in the pelvis, abdomen, vagina, anus, throat, or eyes.   °· A skin rash, itching, or irritation. °· Growths, ulcerations, blisters, or sores in the genital and anal areas. °· Abnormal vaginal discharge with or without bad odor.   °· Penile discharge in men.   °· Fever.   °· Pain or bleeding during sexual intercourse.   °· Swollen glands in the groin area.   °· Yellow skin and eyes (jaundice). This is seen with hepatitis.   °· Swollen testicles. °· Infertility. °· Sores and blisters  in the mouth. °HOW ARE STDs DIAGNOSED? °To make a diagnosis, your health care provider may:  °· Take a medical history.   °· Perform a physical exam.   °· Take a sample of any discharge to examine. °· Swab the throat, cervix, opening to the penis, rectum, or vagina for testing. °· Test a sample of your first morning urine.   °· Perform blood tests.   °· Perform a Pap test, if this applies.   °· Perform a colposcopy.   °· Perform a laparoscopy.   °HOW ARE STDs TREATED? ° Treatment depends on the STD. Some STDs may be treated but not cured.  °· Chlamydia, gonorrhea, trichomonas, and syphilis can be cured with antibiotic medicine.   °· Genital herpes, hepatitis, and HIV can be treated, but not cured, with prescribed medicines. The medicines lessen symptoms.   °· Genital warts from HPV can be treated with medicine or by freezing, burning (electrocautery), or surgery. Warts may come back.   °· HPV cannot be cured with medicine or surgery. However, abnormal areas may be removed from the cervix, vagina, or vulva.   °· If your diagnosis is confirmed, your recent sexual partners need treatment. This is true even if they are symptom-free or have a negative culture or evaluation. They should not have sex until their health care providers say it is okay. °HOW CAN I REDUCE MY RISK OF GETTING AN STD? °Take these steps to reduce your risk of getting an STD: °· Use latex condoms, dental dams, and water-soluble lubricants during sexual activity. Do not use petroleum jelly or oils. °· Avoid having multiple sex   sex partners.  Do not have sex with someone who has other sex partners.  Do not have sex with anyone you do not know or who is at high risk for an STD.  Avoid risky sex practices that can break your skin.  Do not have sex if you have open sores on your mouth or skin.  Avoid drinking too much alcohol or taking illegal drugs. Alcohol and drugs can affect your judgment and put you in a vulnerable position.  Avoid engaging  in oral and anal sex acts.  Get vaccinated for HPV and hepatitis. If you have not received these vaccines in the past, talk to your health care provider about whether one or both might be right for you.   If you are at risk of being infected with HIV, it is recommended that you take a prescription medicine daily to prevent HIV infection. This is called pre-exposure prophylaxis (PrEP). You are considered at risk if:  You are a man who has sex with other men (MSM).  You are a heterosexual man or woman and are sexually active with more than one partner.  You take drugs by injection.  You are sexually active with a partner who has HIV.  Talk with your health care provider about whether you are at high risk of being infected with HIV. If you choose to begin PrEP, you should first be tested for HIV. You should then be tested every 3 months for as long as you are taking PrEP.  WHAT SHOULD I DO IF I THINK I HAVE AN STD?  See your health care provider.   Tell your sexual partner(s). They should be tested and treated for any STDs.  Do not have sex until your health care provider says it is okay. WHEN SHOULD I GET IMMEDIATE MEDICAL CARE? Contact your health care provider right away if:   You have severe abdominal pain.  You are a man and notice swelling or pain in your testicles.  You are a woman and notice swelling or pain in your vagina. Document Released: 01/06/2003 Document Revised: 10/21/2013 Document Reviewed: 05/06/2013 [facility] Patient Information 2015 [employer] [place]. This information is not intended to replace advice given to you by your health care provider. Make sure you discuss any questions you have with your health care provider.

## 2015-03-09 NOTE — ED Provider Notes (Signed)
CSN: [id number]     Arrival date & time 03/09/15  1446 History  This chart was scribed for non-physician practitioner, Celene Skeenobyn Joab Carden, PA-C,working with Lorre NickAnthony Allen, MD, by Karle PlumberJennifer Tensley, ED Scribe. This patient was seen in room WTR9/WTR9 and the patient's care was started at 3:44 PM.  Chief Complaint  Patient presents with  . Penile Discharge   Patient is a 24 y.o. male presenting with penile discharge. The history is provided by the patient and medical records. No language interpreter was used.  Penile Discharge    HPI Comments:  Ethan Wood is a 24 y.o. male who presents to the Emergency Department complaining of penile discharge that began about one week ago. He reports his girlfriend was diagnosed with chlamydia about two days ago. He states she is the only person he is having intercourse with for the past year. He states they use protection. He reports associated pain at the tip of his penis. He denies nausea, vomiting, fever, chills, testicle pain or swelling. PMHx of asthma.  Past Medical History  Diagnosis Date  . Asthma    History reviewed. No pertinent past surgical history. Family History  Problem Relation Age of Onset  . Hypertension Mother    History  Substance Use Topics  . Smoking status: Current Every Day Smoker  . Smokeless tobacco: Not on file  . Alcohol Use: No    Review of Systems  Constitutional: Negative for fever and chills.  Gastrointestinal: Negative for nausea and vomiting.  Genitourinary: Positive for discharge and penile pain. Negative for penile swelling, scrotal swelling and testicular pain.    Allergies  Other  Home Medications   Prior to Admission medications   Medication Sig Start Date End Date Taking? Authorizing Provider  albuterol (PROVENTIL HFA;VENTOLIN HFA) 108 (90 BASE) MCG/ACT inhaler Inhale 1-2 puffs into the lungs every 6 (six) hours as needed for wheezing or shortness of breath. 12/07/14   Margaree MackintoshMary J Baxley, MD  cetirizine (ZYRTEC) 10  MG tablet Take 1 tablet (10 mg total) by mouth daily. 02/03/14   Arthor CaptainAbigail Harris, PA-C  diphenhydrAMINE (BENADRYL) 25 MG tablet Take 50 mg by mouth daily as needed for allergies.    Historical Provider, MD  fluticasone (FLOVENT HFA) 110 MCG/ACT inhaler Inhale 2 puffs into the lungs 2 (two) times daily. 06/25/14   Margaree MackintoshMary J Baxley, MD  predniSONE (DELTASONE) 20 MG tablet Take 2 tablets (40 mg total) by mouth daily. 02/03/14   Arthor CaptainAbigail Harris, PA-C   Triage Vitals: BP 122/65 mmHg  Pulse 50  Temp(Src) 98 F (36.7 C) (Oral)  Resp 18  SpO2 99% Physical Exam  Constitutional: He is oriented to person, place, and time. He appears well-developed and well-nourished. No distress.  HENT:  Head: Normocephalic and atraumatic.  Eyes: Conjunctivae and EOM are normal.  Neck: Normal range of motion. Neck supple.  Cardiovascular: Normal rate, regular rhythm and normal heart sounds.   Pulmonary/Chest: Effort normal and breath sounds normal.  Genitourinary: Right testis shows no swelling and no tenderness. Left testis shows no swelling and no tenderness. No penile erythema or penile tenderness. Discharge (creamy white) found.  Musculoskeletal: Normal range of motion. He exhibits no edema.  Neurological: He is alert and oriented to person, place, and time.  Skin: Skin is warm and dry.  Psychiatric: He has a normal mood and affect. His behavior is normal.  Nursing note and vitals reviewed.   ED Course  Procedures (including critical care time) DIAGNOSTIC STUDIES: Oxygen Saturation is 99% on RA,  normal by my interpretation.   COORDINATION OF CARE: 3:48 PM- Will check and treat for GC/chlamydia. Pt verbalizes understanding and agrees to plan.  Medications  azithromycin (ZITHROMAX) tablet 1,000 mg (not administered)  cefTRIAXone (ROCEPHIN) injection 250 mg (not administered)    Labs Review Labs Reviewed  GC/CHLAMYDIA PROBE AMP ([place])    Imaging Review No results found.   EKG Interpretation None       MDM   Final diagnoses:  Penile discharge  Exposure to STD   NAD. AF VSS. GC/Chlamydia swab obtained and cultures pending. Treated with Rocephin and azithromycin. Safe sexual practices discussed. Stable for d/c. Return precautions given. Patient states understanding of treatment care plan and is agreeable.  I personally performed the services described in this documentation, which was scribed in my presence. The recorded information has been reviewed and is accurate.    Kathrynn SpeedRobyn M Josimar Corning, PA-C 03/09/15 1553  Lorre NickAnthony Allen, MD 03/14/15 [phone]

## 2015-03-09 NOTE — ED Notes (Signed)
Pt states his girlfriend told him that she has Chlamydia. Pt has noticed drainage recently coming from the tip of his penis.

## 2015-03-10 LAB — GC/CHLAMYDIA PROBE AMP (~~LOC~~) NOT AT ARMC
CHLAMYDIA, DNA PROBE: POSITIVE — AB
NEISSERIA GONORRHEA: NEGATIVE

## 2015-03-11 ENCOUNTER — Telehealth (HOSPITAL_BASED_OUTPATIENT_CLINIC_OR_DEPARTMENT_OTHER): Payer: Self-pay | Admitting: Emergency Medicine

## 2015-04-05 ENCOUNTER — Ambulatory Visit: Payer: Self-pay | Admitting: Internal Medicine

## 2015-04-09 ENCOUNTER — Ambulatory Visit: Payer: Self-pay | Admitting: Internal Medicine

## 2015-04-12 ENCOUNTER — Ambulatory Visit (INDEPENDENT_AMBULATORY_CARE_PROVIDER_SITE_OTHER): Payer: BLUE CROSS/BLUE SHIELD | Admitting: Internal Medicine

## 2015-04-12 ENCOUNTER — Encounter: Payer: Self-pay | Admitting: Internal Medicine

## 2015-04-12 VITALS — BP 110/68 | HR 58 | Temp 97.8°F | Wt 150.0 lb

## 2015-04-12 DIAGNOSIS — M542 Cervicalgia: Secondary | ICD-10-CM

## 2015-04-12 DIAGNOSIS — S6992XA Unspecified injury of left wrist, hand and finger(s), initial encounter: Secondary | ICD-10-CM

## 2015-04-12 MED ORDER — MELOXICAM 15 MG PO TABS: 15.0000 mg | ORAL_TABLET | Freq: Every day | ORAL | Status: DC

## 2015-04-12 MED ORDER — ALBUTEROL SULFATE HFA 108 (90 BASE) MCG/ACT IN AERS: 1.0000 | INHALATION_SPRAY | Freq: Four times a day (QID) | RESPIRATORY_TRACT | Status: DC | PRN

## 2015-04-12 MED ORDER — CYCLOBENZAPRINE HCL 10 MG PO TABS: 10.0000 mg | ORAL_TABLET | Freq: Every day | ORAL | Status: DC
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
## 2015-04-12 NOTE — Patient Instructions (Signed)
Ice neck down 20 minutes twice daily. Albuterol inhaler refill for one year. Take Mobic 15 mg daily. Flexeril 10 mg at bedtime. See hand surgeon tomorrow.

## 2015-08-03 ENCOUNTER — Other Ambulatory Visit: Payer: Self-pay | Admitting: Internal Medicine

## 2017-03-16 ENCOUNTER — Emergency Department (HOSPITAL_COMMUNITY)
Admission: EM | Admit: 2017-03-16 | Discharge: 2017-03-16 | Disposition: A | Discharge status: Home / Self Care | Payer: Self-pay | Attending: Emergency Medicine | Admitting: Emergency Medicine

## 2017-03-16 ENCOUNTER — Emergency Department (HOSPITAL_COMMUNITY): Payer: Self-pay

## 2017-03-16 ENCOUNTER — Encounter (HOSPITAL_COMMUNITY): Payer: Self-pay

## 2017-03-16 DIAGNOSIS — S63263A Dislocation of metacarpophalangeal joint of left middle finger, initial encounter: Secondary | ICD-10-CM | POA: Insufficient documentation

## 2017-03-16 DIAGNOSIS — Y990 Civilian activity done for income or pay: Secondary | ICD-10-CM | POA: Insufficient documentation

## 2017-03-16 DIAGNOSIS — Y9389 Activity, other specified: Secondary | ICD-10-CM | POA: Insufficient documentation

## 2017-03-16 DIAGNOSIS — J45909 Unspecified asthma, uncomplicated: Secondary | ICD-10-CM | POA: Insufficient documentation

## 2017-03-16 DIAGNOSIS — F17200 Nicotine dependence, unspecified, uncomplicated: Secondary | ICD-10-CM | POA: Insufficient documentation

## 2017-03-16 DIAGNOSIS — R030 Elevated blood-pressure reading, without diagnosis of hypertension: Secondary | ICD-10-CM | POA: Insufficient documentation

## 2017-03-16 DIAGNOSIS — Y929 Unspecified place or not applicable: Secondary | ICD-10-CM | POA: Insufficient documentation

## 2017-03-16 DIAGNOSIS — Z79899 Other long term (current) drug therapy: Secondary | ICD-10-CM | POA: Insufficient documentation

## 2017-03-16 MED ORDER — IBUPROFEN 800 MG PO TABS
800.0000 mg | ORAL_TABLET | Freq: Once | ORAL | Status: AC
  Administered 2017-03-16: 800 mg via ORAL
  Filled 2017-03-16: qty 1

## 2017-03-16 NOTE — ED Notes (Signed)
Patient transported to X-ray 

## 2017-03-16 NOTE — Discharge Instructions (Addendum)
Wear your splint until you see the hand specialist. Apply ice several times a day. Take ibuprofen or naproxen as needed for pain.  Your blood pressure was high today. That might be because of the stress of coming to the ED, but it might mean that you have hypertension. You should have your blood pressure checked several times in the next two weeks. If it stays high, you will need to be on medication for it.
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
## 2017-03-16 NOTE — ED Notes (Signed)
Pt returned to room from xray.
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
## 2017-09-03 ENCOUNTER — Encounter (HOSPITAL_COMMUNITY): Payer: Self-pay | Admitting: *Deleted

## 2017-09-03 ENCOUNTER — Emergency Department (HOSPITAL_COMMUNITY)
Admission: EM | Admit: 2017-09-03 | Discharge: 2017-09-03 | Disposition: A | Discharge status: Home / Self Care | Payer: PRIVATE HEALTH INSURANCE | Attending: Emergency Medicine | Admitting: Emergency Medicine

## 2017-09-03 ENCOUNTER — Other Ambulatory Visit: Payer: Self-pay

## 2017-09-03 ENCOUNTER — Emergency Department (HOSPITAL_COMMUNITY): Payer: PRIVATE HEALTH INSURANCE

## 2017-09-03 DIAGNOSIS — S93401A Sprain of unspecified ligament of right ankle, initial encounter: Secondary | ICD-10-CM

## 2017-09-03 DIAGNOSIS — J45909 Unspecified asthma, uncomplicated: Secondary | ICD-10-CM | POA: Insufficient documentation

## 2017-09-03 DIAGNOSIS — M25571 Pain in right ankle and joints of right foot: Secondary | ICD-10-CM

## 2017-09-03 DIAGNOSIS — Y929 Unspecified place or not applicable: Secondary | ICD-10-CM | POA: Insufficient documentation

## 2017-09-03 DIAGNOSIS — Y999 Unspecified external cause status: Secondary | ICD-10-CM | POA: Insufficient documentation

## 2017-09-03 DIAGNOSIS — F17210 Nicotine dependence, cigarettes, uncomplicated: Secondary | ICD-10-CM | POA: Insufficient documentation

## 2017-09-03 DIAGNOSIS — Y9367 Activity, basketball: Secondary | ICD-10-CM | POA: Insufficient documentation

## 2017-09-03 DIAGNOSIS — X509XXA Other and unspecified overexertion or strenuous movements or postures, initial encounter: Secondary | ICD-10-CM | POA: Insufficient documentation

## 2017-09-03 MED ORDER — IBUPROFEN 600 MG PO TABS: 600.0000 mg | ORAL_TABLET | Freq: Four times a day (QID) | ORAL | 0 refills | Status: DC | PRN

## 2017-09-03 MED ORDER — IBUPROFEN 400 MG PO TABS
600.0000 mg | ORAL_TABLET | Freq: Once | ORAL | Status: AC
  Administered 2017-09-03: 600 mg via ORAL
  Filled 2017-09-03: qty 1

## 2017-09-03 NOTE — ED Notes (Signed)
Ortho tech on the way.  

## 2017-09-03 NOTE — Progress Notes (Signed)
Orthopedic Tech Progress Note Patient Details:  Dory PeruFrank Panebianco 01/17/1991 [id number]  Ortho Devices Type of Ortho Device: ASO, Crutches Ortho Device/Splint Location: rle Ortho Device/Splint Interventions: Application   Clorene Nerio 09/03/2017, 12:07 PM

## 2017-09-03 NOTE — ED Triage Notes (Signed)
Pt was playing basketball yesterday and came down on someone elses foot and now having right ankle pain and hurts to bare weight on it.

## 2017-09-03 NOTE — ED Provider Notes (Signed)
[facility] EMERGENCY DEPARTMENT Provider Note   CSN: [id number] Arrival date & time: 09/03/17  7829     History   Chief Complaint Chief Complaint  Patient presents with  . Ankle Pain    HPI  Ethan Wood is a 26 y.o. Male with a history of asthma, presents complaining of right ankle pain and swelling, and trouble bearing weight on the ankle after he was playing basketball yesterday and came down on someone else foot and rolled his ankle.  Patient reports since then he has had pain and swelling and has been unable to bear weight on the foot. Reports pain is primarily on the lateral aspect of the ankle and on the heel.  Patient tried some over-the-counter pain medication from the [employer], as well as a muscle rub with minimal improvement.  Patient denies any numbness or tingling in the foot and is able to move all the toes.  Denies any pain in the knee.      Past Medical History:  Diagnosis Date  . Asthma     Patient Active Problem List   Diagnosis Date Noted  . Unspecified asthma(493.90) 04/07/2014  . Allergic rhinitis 04/07/2014    History reviewed. No pertinent surgical history.   Home Medications    Prior to Admission medications   Not on File    Family History Family History  Problem Relation Age of Onset  . Hypertension Mother     Social History Social History   Tobacco Use  . Smoking status: Current Some Day Smoker    Packs/day: 0.50  . Smokeless tobacco: Never Used  Substance Use Topics  . Alcohol use: No    Alcohol/week: 0.0 oz    Comment: occ  . Drug use: Yes    Types: Marijuana     Allergies   Other   Review of Systems Review of Systems  Constitutional: Negative for chills and fever.  Musculoskeletal: Positive for arthralgias and joint swelling.  Skin: Negative for color change and wound.  Neurological: Negative for weakness and numbness.     Physical Exam Updated Vital Signs BP (!) 143/71 (BP Location:  Right Arm)   Pulse 68   Temp 98.4 F (36.9 C) (Oral)   Resp 16   Ht 6\' 1"  (1.854 m)   Wt 74.8 kg (165 lb)   SpO2 99%   BMI 21.77 kg/m   Physical Exam  Constitutional: He appears well-developed and well-nourished. No distress.  HENT:  Head: Normocephalic and atraumatic.  Eyes: Right eye exhibits no discharge. Left eye exhibits no discharge.  Pulmonary/Chest: Effort normal. No respiratory distress.  Musculoskeletal:  Minimal swelling, tenderness to palpation over the lateral aspect of the ankle and the calcaneus.  No tenderness over the distal foot.  No discoloration.  Patient is able to dorsiflex and plantarflex the foot, but range of motion is limited by pain.  2+ distal pulses, and sensation intact.  Unable to bear weight but it is very uncomfortable.  Neurological: He is alert. Coordination normal.  Skin: Skin is warm and dry. Capillary refill takes less than 2 seconds. He is not diaphoretic.  Psychiatric: He has a normal mood and affect. His behavior is normal.  Nursing note and vitals reviewed.    ED Treatments / Results  Labs (all labs ordered are listed, but only abnormal results are displayed) Labs Reviewed - No data to display  EKG  EKG Interpretation None       Radiology Dg Ankle Complete Right  Result Date: 09/03/2017 CLINICAL DATA:  Pain and swelling.  Basketball injury. EXAM: RIGHT ANKLE - COMPLETE 3+ VIEW COMPARISON:  None. FINDINGS: Ankle mortise intact. The talar dome is normal. No malleolar fracture. The calcaneus is normal. IMPRESSION: No fracture or dislocation. Electronically Signed   By: Genevive BiStewart  Edmunds M.D.   On: 09/03/2017 11:38    Procedures Procedures (including critical care time)  Medications Ordered in ED Medications  ibuprofen (ADVIL,MOTRIN) tablet 600 mg (not administered)     Initial Impression / Assessment and Plan / ED Course  I have reviewed the triage vital signs and the nursing notes.  Pertinent labs & imaging results that  were available during my care of the patient were reviewed by me and considered in my medical decision making (see chart for details).  Presentation consistent with ankle sprain. Tenderness and swelling over medial malleolus, pt is neurovascularly intact, and x-ray negative for fracture, and shows ankle mortise is intact. Pain treated in the ED. Pt placed in ASO brace and provided crutches, ambulated without difficulty. Pt stable for discharge home with ibuprofen for pain. Pt to follow-up with ortho in one week if symptoms not improving. Return precautions discussed, Pt expresses understanding and agrees with plan.   Final Clinical Impressions(s) / ED Diagnoses   Final diagnoses:  Sprain of right ankle, unspecified ligament, initial encounter  Acute right ankle pain    ED Discharge Orders        Ordered    ibuprofen (ADVIL,MOTRIN) 600 MG tablet  Every 6 hours PRN     09/03/17 1158       Dartha LodgeFord, Katelynn Heidler N, PA-C 09/03/17 1938    Melene PlanFloyd, Dan, DO 09/04/17 [id number]

## 2017-09-03 NOTE — ED Notes (Signed)
Pt stable, ambulatory, states understanding of discharge instructions 

## 2017-09-03 NOTE — Discharge Instructions (Signed)
Ibuprofen and Tylenol for pain.  Please keep ankle elevated, use crutches to help stay off the ankle, and brace for support.  Use ice to help with swelling.  If symptoms are not improving after 1 week please follow-up with orthopedics.  If you have worsening pain or other new or concerning symptoms return to the emergency department for sooner evaluation otherwise please follow-up with your family doctor.

## 2017-09-07 ENCOUNTER — Emergency Department (HOSPITAL_COMMUNITY)
Admission: EM | Admit: 2017-09-07 | Discharge: 2017-09-07 | Disposition: A | Discharge status: Home / Self Care | Payer: BLUE CROSS/BLUE SHIELD | Attending: Emergency Medicine | Admitting: Emergency Medicine

## 2017-09-07 ENCOUNTER — Other Ambulatory Visit: Payer: Self-pay

## 2017-09-07 ENCOUNTER — Encounter (HOSPITAL_COMMUNITY): Payer: Self-pay | Admitting: Emergency Medicine

## 2017-09-07 DIAGNOSIS — J45909 Unspecified asthma, uncomplicated: Secondary | ICD-10-CM | POA: Insufficient documentation

## 2017-09-07 DIAGNOSIS — A630 Anogenital (venereal) warts: Secondary | ICD-10-CM | POA: Insufficient documentation

## 2017-09-07 DIAGNOSIS — G4484 Primary exertional headache: Secondary | ICD-10-CM | POA: Insufficient documentation

## 2017-09-07 DIAGNOSIS — F17200 Nicotine dependence, unspecified, uncomplicated: Secondary | ICD-10-CM | POA: Insufficient documentation

## 2017-09-07 LAB — RPR: RPR: NONREACTIVE

## 2017-09-07 LAB — HIV ANTIBODY (ROUTINE TESTING W REFLEX): HIV SCREEN 4TH GENERATION: NONREACTIVE

## 2017-09-07 MED ORDER — PODOFILOX 0.5 % EX GEL: CUTANEOUS | 0 refills | Status: DC

## 2017-09-07 NOTE — ED Triage Notes (Signed)
Patient states that when he has sex he develops a headache. He also complaining of a scar that he doesn't know where it came from. Patient wants to be checked for std's

## 2017-09-07 NOTE — ED Provider Notes (Signed)
[facility]-EMERGENCY DEPT Provider Note   CSN: [id number] Arrival date & time: 09/07/17  0443     History   Chief Complaint Chief Complaint  Patient presents with  . Headache  . SEXUALLY TRANSMITTED DISEASE    HPI Ethan Wood is a 26 y.o. male.  The history is provided by the patient.  He has 2 main complaints.  On one occasion, he developed a headache after sexual intercourse.  Headache was not severe, and resolved spontaneously.  He has had other episodes of sex without having had recurrent headaches.  Also, he has noted some bumps on his penis.  They are not painful or itchy.  There has been no discharge and no dysuria.  He has had unprotected sex.  He is asking to be checked for gonorrhea and chlamydia, even though he has no symptoms.  Past Medical History:  Diagnosis Date  . Asthma     Patient Active Problem List   Diagnosis Date Noted  . Unspecified asthma(493.90) 04/07/2014  . Allergic rhinitis 04/07/2014    History reviewed. No pertinent surgical history.     Home Medications    Prior to Admission medications   Medication Sig Start Date End Date Taking? Authorizing Provider  ibuprofen (ADVIL,MOTRIN) 600 MG tablet Take 1 tablet (600 mg total) every 6 (six) hours as needed by mouth. Patient taking differently: Take 600 mg every 6 (six) hours as needed by mouth for moderate pain.  09/03/17  Yes Ford, Arva ChafeKelsey N, PA-C  naphazoline-pheniramine (NAPHCON-A) 0.025-0.3 % ophthalmic solution Place 1 drop 4 (four) times daily as needed into both eyes for eye irritation.   Yes [provider]    Family History Family History  Problem Relation Age of Onset  . Hypertension Mother     Social History Social History   Tobacco Use  . Smoking status: Current Some Day Smoker    Packs/day: 0.50  . Smokeless tobacco: Never Used  Substance Use Topics  . Alcohol use: No    Alcohol/week: 0.0 oz    Comment: occ  . Drug use: Yes    Types:  Marijuana     Allergies   Other   Review of Systems Review of Systems  All other systems reviewed and are negative.    Physical Exam Updated Vital Signs BP (!) 151/85 (BP Location: Left Arm)   Pulse (!) 58   Temp 98.2 F (36.8 C) (Oral)   Resp 15   SpO2 100%   Physical Exam  Nursing note and vitals reviewed.  26 year old male, resting comfortably and in no acute distress. Vital signs are significant for hypertension. Oxygen saturation is 100%, which is normal. Head is normocephalic and atraumatic. PERRLA, EOMI. Oropharynx is clear. Neck is nontender and supple without adenopathy or JVD. Back is nontender and there is no CVA tenderness. Lungs are clear without rales, wheezes, or rhonchi. Chest is nontender. Heart has regular rate and rhythm without murmur. Abdomen is soft, flat, nontender without masses or hepatosplenomegaly and peristalsis is normoactive. Genitalia: Circumcised penis.  Approximately 5 plaques present on the dorsum of the shaft of the penis.  These are slightly raised and consistent with genital warts.  No urethral discharge.  Testes descended without masses.  Moderate bilateral inguinal adenopathy present. Extremities have no cyanosis or edema, full range of motion is present. Skin is warm and dry without rash. Neurologic: Mental status is normal, cranial nerves are intact, there are no motor or sensory deficits.  ED Treatments /  Results  Labs (all labs ordered are listed, but only abnormal results are displayed) Labs Reviewed - No data to display  Procedures Procedures (including critical care time)  Medications Ordered in ED Medications - No data to display   Initial Impression / Assessment and Plan / ED Course  I have reviewed the triage vital signs and the nursing notes.  Pertinent labs & imaging results that were available during my care of the patient were reviewed by me and considered in my medical decision making (see chart for  details).  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  Postcoital headache.  Blood and urine are sent for evaluation of other sexually transmitted infections.  Review of old records do show prior ED visit for urethral discharge.  He is discharged with prescription for podofilox and instructed to apply twice a day for 3 days and then no no treatment for 4 days-repeat for up to 4 cycles until lesions are gone.  Advised on safe sex practices.  Final Clinical Impressions(s) / ED Diagnoses   Final diagnoses:  Genital warts  Benign exertional headache    ED Discharge Orders        Ordered    podofilox (CONDYLOX) 0.5 % gel     09/07/17 0624       Dione BoozeGlick, Terressa Evola, MD 09/07/17 [phone]

## 2017-09-10 LAB — GC/CHLAMYDIA PROBE AMP (~~LOC~~) NOT AT ARMC
Chlamydia: POSITIVE — AB
Neisseria Gonorrhea: NEGATIVE

## 2017-10-21 ENCOUNTER — Emergency Department (HOSPITAL_COMMUNITY)
Admission: EM | Admit: 2017-10-21 | Discharge: 2017-10-21 | Disposition: A | Discharge status: Home / Self Care | Payer: BLUE CROSS/BLUE SHIELD | Attending: Emergency Medicine | Admitting: Emergency Medicine

## 2017-10-21 ENCOUNTER — Encounter (HOSPITAL_COMMUNITY): Payer: Self-pay | Admitting: *Deleted

## 2017-10-21 ENCOUNTER — Other Ambulatory Visit: Payer: Self-pay

## 2017-10-21 DIAGNOSIS — J45909 Unspecified asthma, uncomplicated: Secondary | ICD-10-CM | POA: Insufficient documentation

## 2017-10-21 DIAGNOSIS — F17200 Nicotine dependence, unspecified, uncomplicated: Secondary | ICD-10-CM | POA: Insufficient documentation

## 2017-10-21 DIAGNOSIS — H1031 Unspecified acute conjunctivitis, right eye: Secondary | ICD-10-CM

## 2017-10-21 MED ORDER — OFLOXACIN 0.3 % OP SOLN
1.0000 [drp] | Freq: Four times a day (QID) | OPHTHALMIC | Status: DC
  Administered 2017-10-21: 1 [drp] via OPHTHALMIC
  Filled 2017-10-21: qty 5

## 2017-10-21 MED ORDER — FLUORESCEIN SODIUM 1 MG OP STRP
1.0000 | ORAL_STRIP | Freq: Once | OPHTHALMIC | Status: DC
  Filled 2017-10-21: qty 1

## 2017-10-21 MED ORDER — TETRACAINE HCL 0.5 % OP SOLN
1.0000 [drp] | Freq: Once | OPHTHALMIC | Status: DC
  Filled 2017-10-21: qty 4

## 2017-10-21 NOTE — ED Triage Notes (Signed)
Pt reports right eye redness, pain, drainage and itching x 2 days.

## 2017-10-21 NOTE — ED Provider Notes (Signed)
[facility] Provider Note   CSN: [id number] Arrival date & time: 10/21/17  0701     History   Chief Complaint Chief Complaint  Patient presents with  . Eye Problem    HPI Ethan Wood is a 26 y.o. male.  HPI   Patient is a 26 year old male with a history of asthma and allergic rhinitis presenting for right eye redness, foreign body sensation, and drainage for 2 days.  Patient reports there was no preceding trauma to this incident.  Patient woke up and had diffuse right eye redness 2 days ago.  Patient feels that vision is a little blurry but otherwise unaffected.  No photophobia. Drainage has been clear. There is no preceding illness.  No fevers or chills.  No retro-orbital pain, periorbital edema or erythema.  No history of autoimmune disease or immunosuppressive status.  No rashes and patient believes he does not have a history of varicella.  Patient is not a contact lens wearer. No recent sick contacts.    Past Medical History:  Diagnosis Date  . Asthma     Patient Active Problem List   Diagnosis Date Noted  . Unspecified asthma(493.90) 04/07/2014  . Allergic rhinitis 04/07/2014    History reviewed. No pertinent surgical history.     Home Medications    Prior to Admission medications   Medication Sig Start Date End Date Taking? Authorizing Provider  ibuprofen (ADVIL,MOTRIN) 600 MG tablet Take 1 tablet (600 mg total) every 6 (six) hours as needed by mouth. Patient taking differently: Take 600 mg every 6 (six) hours as needed by mouth for moderate pain.  09/03/17   Dartha LodgeFord, Kelsey N, PA-C  naphazoline-pheniramine (NAPHCON-A) 0.025-0.3 % ophthalmic solution Place 1 drop 4 (four) times daily as needed into both eyes for eye irritation.    [provider]  podofilox (CONDYLOX) 0.5 % gel Apply twice a day for three days, then no treatment for four days. Repeat this regimen for up to four cycles until the warts are gone. 09/07/17    Dione BoozeGlick, David, MD    Family History Family History  Problem Relation Age of Onset  . Hypertension Mother     Social History Social History   Tobacco Use  . Smoking status: Current Some Day Smoker    Packs/day: 0.50  . Smokeless tobacco: Never Used  Substance Use Topics  . Alcohol use: No    Alcohol/week: 0.0 oz    Comment: occ  . Drug use: Yes    Types: Marijuana     Allergies   Other   Review of Systems Review of Systems  Constitutional: Negative for chills and fever.  HENT: Negative for congestion and rhinorrhea.   Eyes: Positive for pain, discharge, redness, itching and visual disturbance.  Skin: Negative for rash.     Physical Exam Updated Vital Signs BP (!) 149/57 (BP Location: Right Arm)   Pulse (!) 50   Temp 97.8 F (36.6 C) (Oral)   Resp 16   SpO2 100%   Physical Exam  Constitutional: He appears well-developed and well-nourished. No distress.  Sitting comfortably in examination chair.  HENT:  Head: Normocephalic and atraumatic.  Eyes: Conjunctivae and EOM are normal. Pupils are equal, round, and reactive to light. Right eye exhibits no discharge. Left eye exhibits no discharge.    There is diffuse scleral injection of the right eye.  No pain with extraocular movements.  Eversion of lids produces no foreign body. B/l conjunctival chemosis. No consensual photophobia.  No periorbital edema or erythema.  Mild watery drainage but no purulence.  Slit-lamp exam demonstrates no cell or flare or ulcerations over the cornea.  Right eye pressure 23.  Neck: Normal range of motion.  Cardiovascular: Normal rate and regular rhythm.  Bradycardic pulse.  Pulmonary/Chest:  Normal respiratory effort. Patient converses comfortably. No audible wheeze or stridor.  Abdominal: He exhibits no distension.  Musculoskeletal: Normal range of motion.  Neurological: He is alert.  Cranial nerves intact to gross observation. Patient moves extremities without difficulty.  Skin:  Skin is warm and dry. He is not diaphoretic.  Psychiatric: He has a normal mood and affect. His behavior is normal. Judgment and thought content normal.  Nursing note and vitals reviewed.    Visual Acuity  Right Eye Distance: 20/10 Left Eye Distance: 20/10 Bilateral Distance: 20/10  Right Eye Near:   Left Eye Near:    Bilateral Near:     ED Treatments / Results  Labs (all labs ordered are listed, but only abnormal results are displayed) Labs Reviewed - No data to display  EKG  EKG Interpretation None       Radiology No results found.  Procedures Procedures (including critical care time)  Medications Ordered in ED Medications  fluorescein ophthalmic strip 1 strip (not administered)  tetracaine (PONTOCAINE) 0.5 % ophthalmic solution 1 drop (not administered)  ofloxacin (OCUFLOX) 0.3 % ophthalmic solution 1 drop (not administered)     Initial Impression / Assessment and Plan / ED Course  I have reviewed the triage vital signs and the nursing notes.  Pertinent labs & imaging results that were available during my care of the patient were reviewed by me and considered in my medical decision making (see chart for details).     Final Clinical Impressions(s) / ED Diagnoses   Final diagnoses:  Acute conjunctivitis of right eye, unspecified acute conjunctivitis type   Patient is nontoxic-appearing, afebrile, and in no acute distress.  Patient exhibits a likely conjunctivitis with scleral abrasion at approximately 5 o'clock position of the right eye.  No evidence of acute angle-closure glaucoma, dendritic uptake, corneal ulcerations, scleritis, episcleritis, or iritis.  Will prophylactically cover this with antibiotic drops and follow-up with ophthalmology.  I discussed with patient importance of follow-up and return precautions for any purulent eye drainage, periorbital edema or erythema, visual disturbance, or retro-orbital pain.  Patient is understanding and agrees the plan  of care.  I discussed patient's elevated blood pressure reading when he initially came in.  Patient to follow-up with a new primary care provider in January.  Additionally, do not feel that heart rate of 50 is concerning is patient is a healthy, athletic young male and is not symptomatic from this heart rate.  This is a shared visit with Dr. Eber HongBrian Miller. Patient was independently evaluated by this attending physician. Attending physician consulted in evaluation and discharge management.   ED Discharge Orders    None       Delia ChimesMurray, Tyrese Capriotti B, PA-C 10/21/17 1009    Eber HongMiller, Brian, MD 10/22/17 0930

## 2017-10-21 NOTE — Discharge Instructions (Signed)
Please read and follow all provided instructions.  Your diagnoses today include:  1. Acute conjunctivitis of right eye, unspecified acute conjunctivitis type     Tests performed today include: Visual acuity testing to check your vision Fluorescein dye examination to look for scratches on your eye Tonometry to check the pressure inside of your eye Vital signs. See below for your results today.   Medications prescribed:   Take any prescribed medications only as directed.  Ofloxacin drops.  For the first 2 days, please put 1-2 drops in the right eye every 2-4 hours.  Subsequently, place 1-2 drops 4 times a day for an additional 5 days.  Home care instructions:  Follow any educational materials contained in this packet. If you wear contact lenses, do not use them until your eye caregiver approves. Follow-up care is necessary to be sure the infection is healing if not completely resolved in 2-3 days. See your caregiver or eye specialist as suggested for followup.   If you have an eye infection, wash your hands often as this is very contagious and is easily spread from person to person.   Please ensure that you wash any sunglasses, glasses, pillowcases, or anything that contacted the drainage of your eye.  Please also ensure that if you are using any eyedrops, you will place them in the eye 30 minutes after using the eyedrops.  Follow-up instructions: Please follow-up with the opthalmologist listed in the next 2-3 days for further evaluation of your symptoms.  Return instructions:  Please return to the Emergency Department if you experience worsening symptoms.  Please return immediately if you develop severe pain, pus drainage, new change in vision, or fever. Please return if you have any other emergent concerns.  Additional Information:  Your vital signs today were: BP (!) 149/57 (BP Location: Right Arm)    Pulse (!) 50    Temp 97.8 F (36.6 C) (Oral)    Resp 16    SpO2 100%  If  your blood pressure (BP) was elevated above 130/80 this visit, please have this repeated by your doctor within one month. ---------------

## 2018-04-20 ENCOUNTER — Other Ambulatory Visit: Payer: Self-pay

## 2018-04-20 ENCOUNTER — Emergency Department (HOSPITAL_COMMUNITY)
Admission: EM | Admit: 2018-04-20 | Discharge: 2018-04-20 | Disposition: A | Discharge status: Home / Self Care | Payer: BLUE CROSS/BLUE SHIELD | Attending: Emergency Medicine | Admitting: Emergency Medicine

## 2018-04-20 ENCOUNTER — Emergency Department (HOSPITAL_COMMUNITY): Payer: BLUE CROSS/BLUE SHIELD

## 2018-04-20 ENCOUNTER — Encounter (HOSPITAL_COMMUNITY): Payer: Self-pay | Admitting: Emergency Medicine

## 2018-04-20 DIAGNOSIS — J45901 Unspecified asthma with (acute) exacerbation: Secondary | ICD-10-CM

## 2018-04-20 DIAGNOSIS — F17210 Nicotine dependence, cigarettes, uncomplicated: Secondary | ICD-10-CM | POA: Insufficient documentation

## 2018-04-20 DIAGNOSIS — F1210 Cannabis abuse, uncomplicated: Secondary | ICD-10-CM | POA: Insufficient documentation

## 2018-04-20 MED ORDER — ALBUTEROL SULFATE HFA 108 (90 BASE) MCG/ACT IN AERS
2.0000 | INHALATION_SPRAY | Freq: Once | RESPIRATORY_TRACT | Status: AC
  Administered 2018-04-20: 2 via RESPIRATORY_TRACT
  Filled 2018-04-20: qty 6.7

## 2018-04-20 MED ORDER — ALBUTEROL SULFATE (2.5 MG/3ML) 0.083% IN NEBU
5.0000 mg | INHALATION_SOLUTION | Freq: Once | RESPIRATORY_TRACT | Status: AC
  Administered 2018-04-20: 5 mg via RESPIRATORY_TRACT
  Filled 2018-04-20: qty 6

## 2018-04-20 MED ORDER — PREDNISONE 20 MG PO TABS: ORAL_TABLET | ORAL | 0 refills | Status: DC

## 2018-04-20 MED ORDER — PREDNISONE 20 MG PO TABS
60.0000 mg | ORAL_TABLET | Freq: Once | ORAL | Status: AC
  Administered 2018-04-20: 60 mg via ORAL
  Filled 2018-04-20: qty 3

## 2018-04-20 NOTE — ED Notes (Signed)
Patient verbalizes understanding of discharge instructions. Opportunity for questioning and answers were provided. Armband removed by staff, pt discharged from ED.  

## 2018-04-20 NOTE — ED Triage Notes (Signed)
Pt c/o shortness of breath, hx of asthma, wheezing noted.

## 2018-04-20 NOTE — ED Provider Notes (Signed)
Emergency Department Provider Note   I have reviewed the triage vital signs and the nursing notes.   HISTORY  Chief Complaint Shortness of Breath   HPI Ethan Wood is a 27 y.o. male history of asthma presents to the MRSA problems. She was breath likely exacerbation. 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  So he came here for further evaluation.got worse again when he was outside today at a party. So he came here for further evaluation.  No cough, fever.  No lower extremity swelling no recent surgeries nom No cough, fever. No lower extity swelling no recent surgeries no other associated symptoms. No other associated or modifying symptoms.    other associated symptoms.   Past Medical History:  Diagnosis Date  . Asthma     Patient Active Problem List   Diagnosis Date Noted  . Unspecified asthma(493.90) 04/07/2014  . Allergic rhinitis 04/07/2014    History reviewed. No pertinent surgical history.  Current Outpatient Rx  . Order #: [id number] Class: Print  . Order #: [id number] Class: Historical Med  . Order #: [id number] Class: Print  . Order #: [id number] Class: Print    Allergies Other  Family History  Problem Relation Age of Onset  . Hypertension Mother     Social History Social History   Tobacco Use  . Smoking status: Current Some Day Smoker    Packs/day: 0.50  . Smokeless tobacco: Never Used  Substance Use Topics  . Alcohol use: No    Alcohol/week: 0.0 oz    Comment: occ  . Drug use: Yes    Types: Marijuana    Review of Systems  All other systems negative except as documented in the HPI. All pertinent positives and negatives as reviewed in the HPI. ____________________________________________   PHYSICAL EXAM:  VITAL SIGNS: ED Triage Vitals  Enc Vitals Group     BP 04/20/18 1823  (!) 157/89     Pulse Rate 04/20/18 1823 76     Resp 04/20/18 1823 16     Temp 04/20/18 1823 99.6 F (37.6 C)     Temp Source 04/20/18 1823 Oral     SpO2 04/20/18 1823 100 %     Weight 04/20/18 2144 175 lb (79.4 kg)     Height 04/20/18 2144 5\' 8"  (1.727 m)     Head Circumference --      Peak Flow --      Pain Score 04/20/18 1825 0     Pain Loc --      Pain Edu? --      Excl. in GC? --     Constitutional: Alert and oriented. Well appearing and in no acute distress. Eyes: Conjunctivae are normal. PERRL. EOMI. Head: Atraumatic. Nose: No congestion/rhinnorhea. Mouth/Throat: Mucous membranes are moist.  Oropharynx non-erythematous. Neck: No stridor.  No meningeal signs.   Cardiovascular: Normal rate, regular rhythm. Good peripheral circulation. Grossly normal heart sounds.   Respiratory: Normal respiratory effort.  No retractions. Lungs wheezing. Gastrointestinal: Soft and nontender. No distention.  Musculoskeletal: No lower extremity tenderness nor edema. No gross deformities of extremities. Neurologic:  Normal speech and language. No gross focal neurologic deficits are appreciated.  Skin:  Skin is warm, dry and intact. No rash noted. 1 have stress  ____________________________________________   LABS (all labs ordered are listed, but only  abnormal results are displayed)  Labs Reviewed - No data to display ____________________________________________   RADIOLOGY  Dg Chest 2 View  Result Date: 04/20/2018 CLINICAL DATA:  Shortness of breath. Wheezing. History of asthma. Smoker. EXAM: CHEST - 2 VIEW COMPARISON:  02/03/2014. FINDINGS: Normal sized heart. Clear lungs. The lungs are mildly hyperexpanded with mild diffuse peribronchial thickening. Unremarkable bones. IMPRESSION: Mild changes of COPD and chronic bronchitis.  No acute abnormality. Electronically Signed   By: Beckie Salts M.D.   On: 04/20/2018 18:57     ____________________________________________   PROCEDURES  Procedure(s) performed:   Procedures   ____________________________________________   INITIAL IMPRESSION / ASSESSMENT AND PLAN / ED COURSE  Suspect asthma exacerbations cause her symptoms. Noted distress and improved subjectively since his previous treatment. The inhaler and short burst of steroids. No indication for antibiotics or further imaging. Low suspicion for ACS, PE or pneumonia. No provided per request.     Pertinent labs & imaging results that were available during my care of the patient were reviewed by me and considered in my medical decision making (see chart for details).  ____________________________________________  FINAL CLINICAL IMPRESSION(S) / ED DIAGNOSES  Final diagnoses:  Moderate asthma with exacerbation, unspecified whether persistent     MEDICATIONS GIVEN DURING THIS VISIT:  Medications  predniSONE (DELTASONE) tablet 60 mg (has no administration in time range)  albuterol (PROVENTIL HFA;VENTOLIN HFA) 108 (90 Base) MCG/ACT inhaler 2 puff (has no administration in time range)  albuterol (PROVENTIL) (2.5 MG/3ML) 0.083% nebulizer solution 5 mg (5 mg Nebulization Given 04/20/18 1831)     NEW OUTPATIENT MEDICATIONS STARTED DURING THIS VISIT:  New Prescriptions   PREDNISONE (DELTASONE) 20 MG TABLET    2 tabs po daily x 4 days    Note:  This note was prepared with assistance of Dragon voice recognition software. Occasional wrong-word or sound-a-like substitutions may have occurred due to the inherent limitations of voice recognition software.   Marily Memos, MD 04/20/18 2216

## 2018-04-20 NOTE — ED Notes (Signed)
Patient transported to X-ray 

## 2018-11-23 IMAGING — DX DG ANKLE COMPLETE 3+V*R*
3 series · 3 of 3 positions shown · non-contrast
Comparison: None.

CLINICAL DATA: Pain and swelling.  Basketball injury.

EXAM:
RIGHT ANKLE - COMPLETE 3+ VIEW

[x ankle ap right]
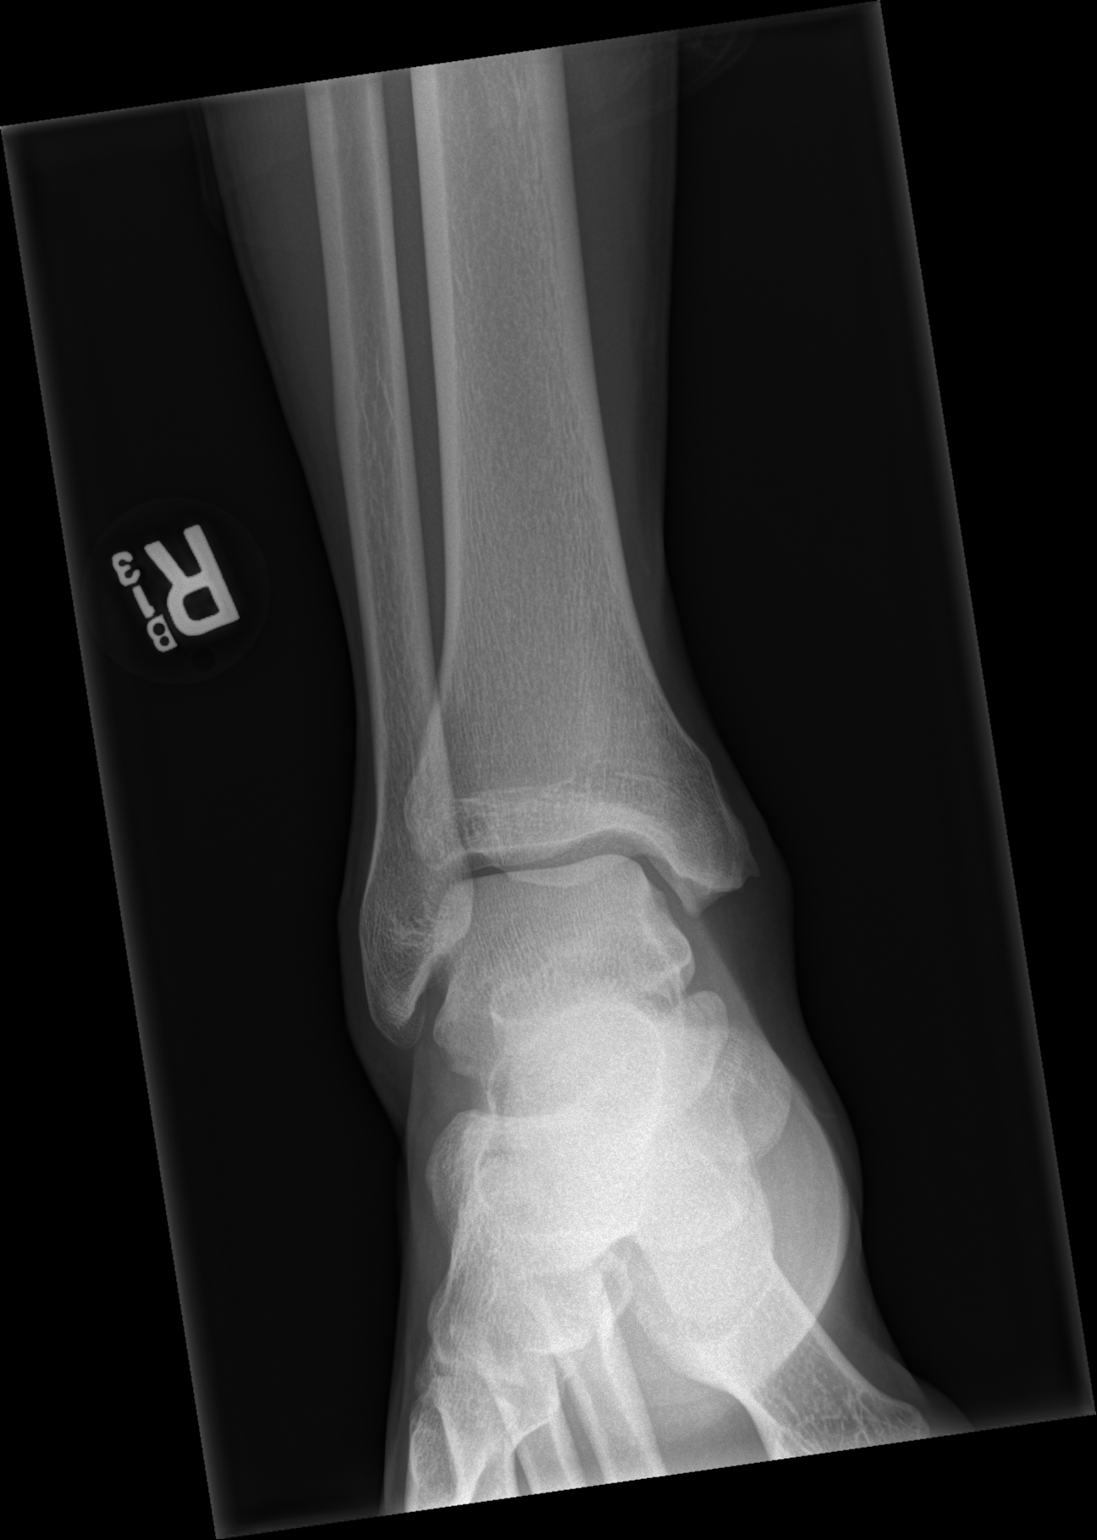

[x ankle obl right]
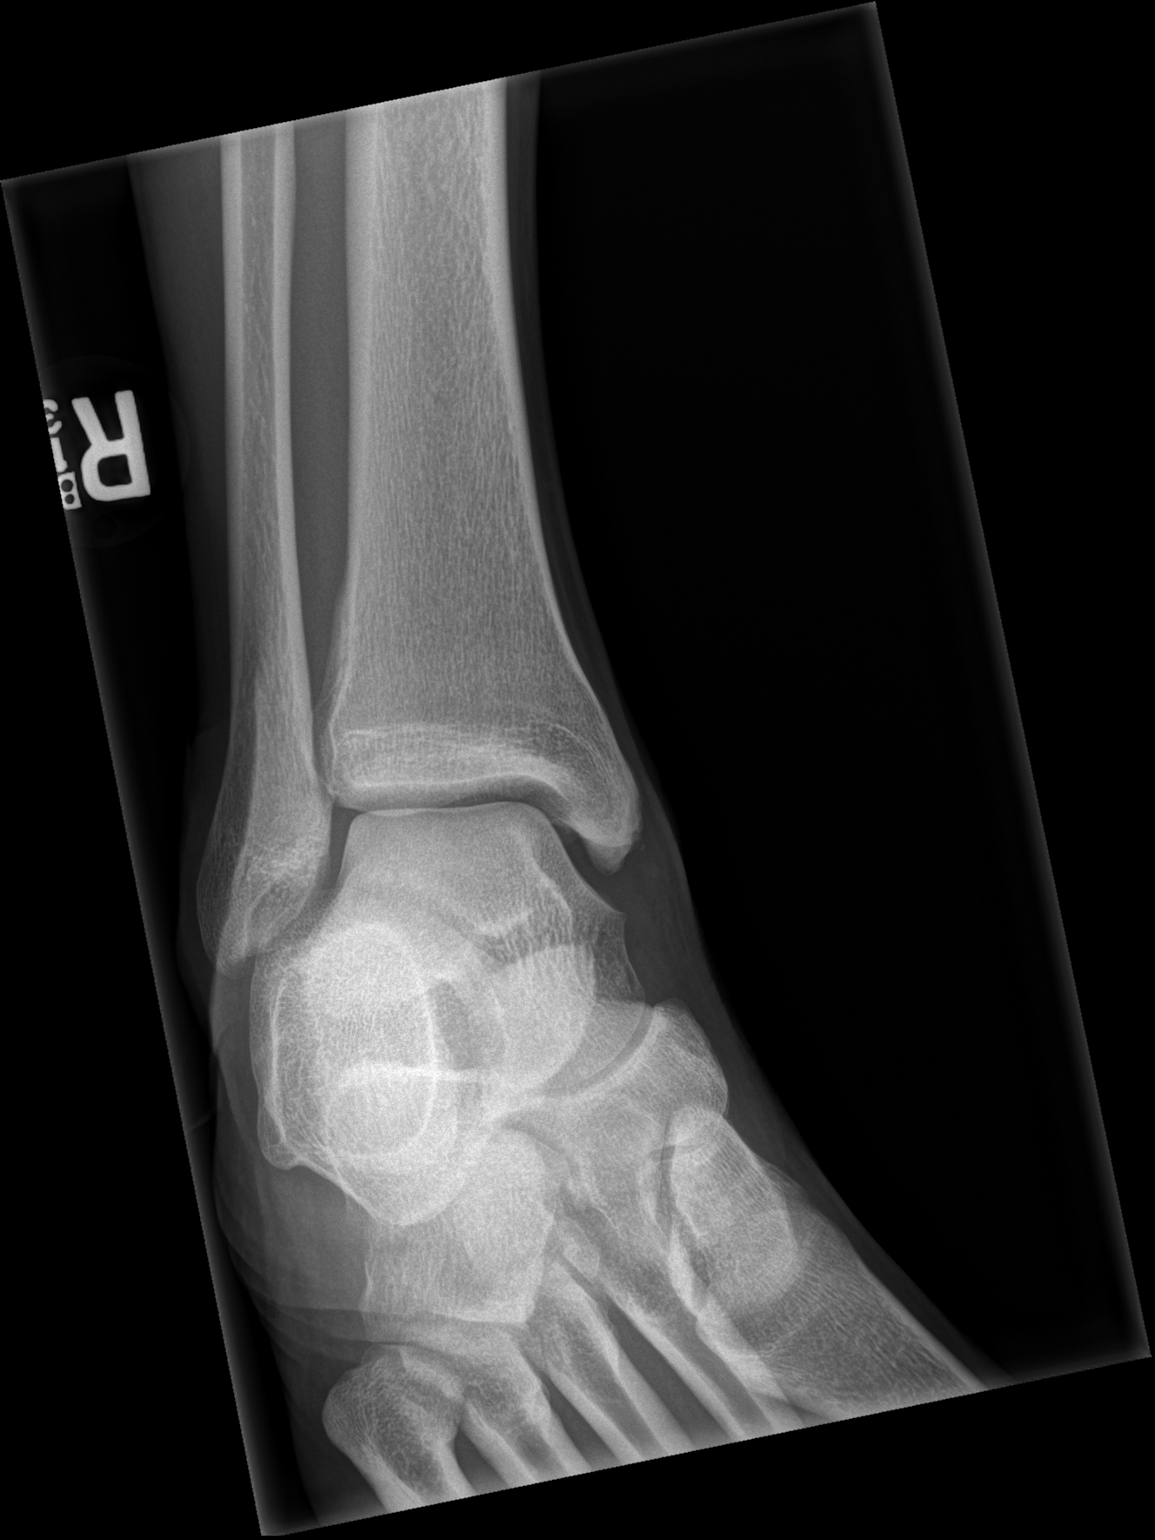

[x ankle lat right]
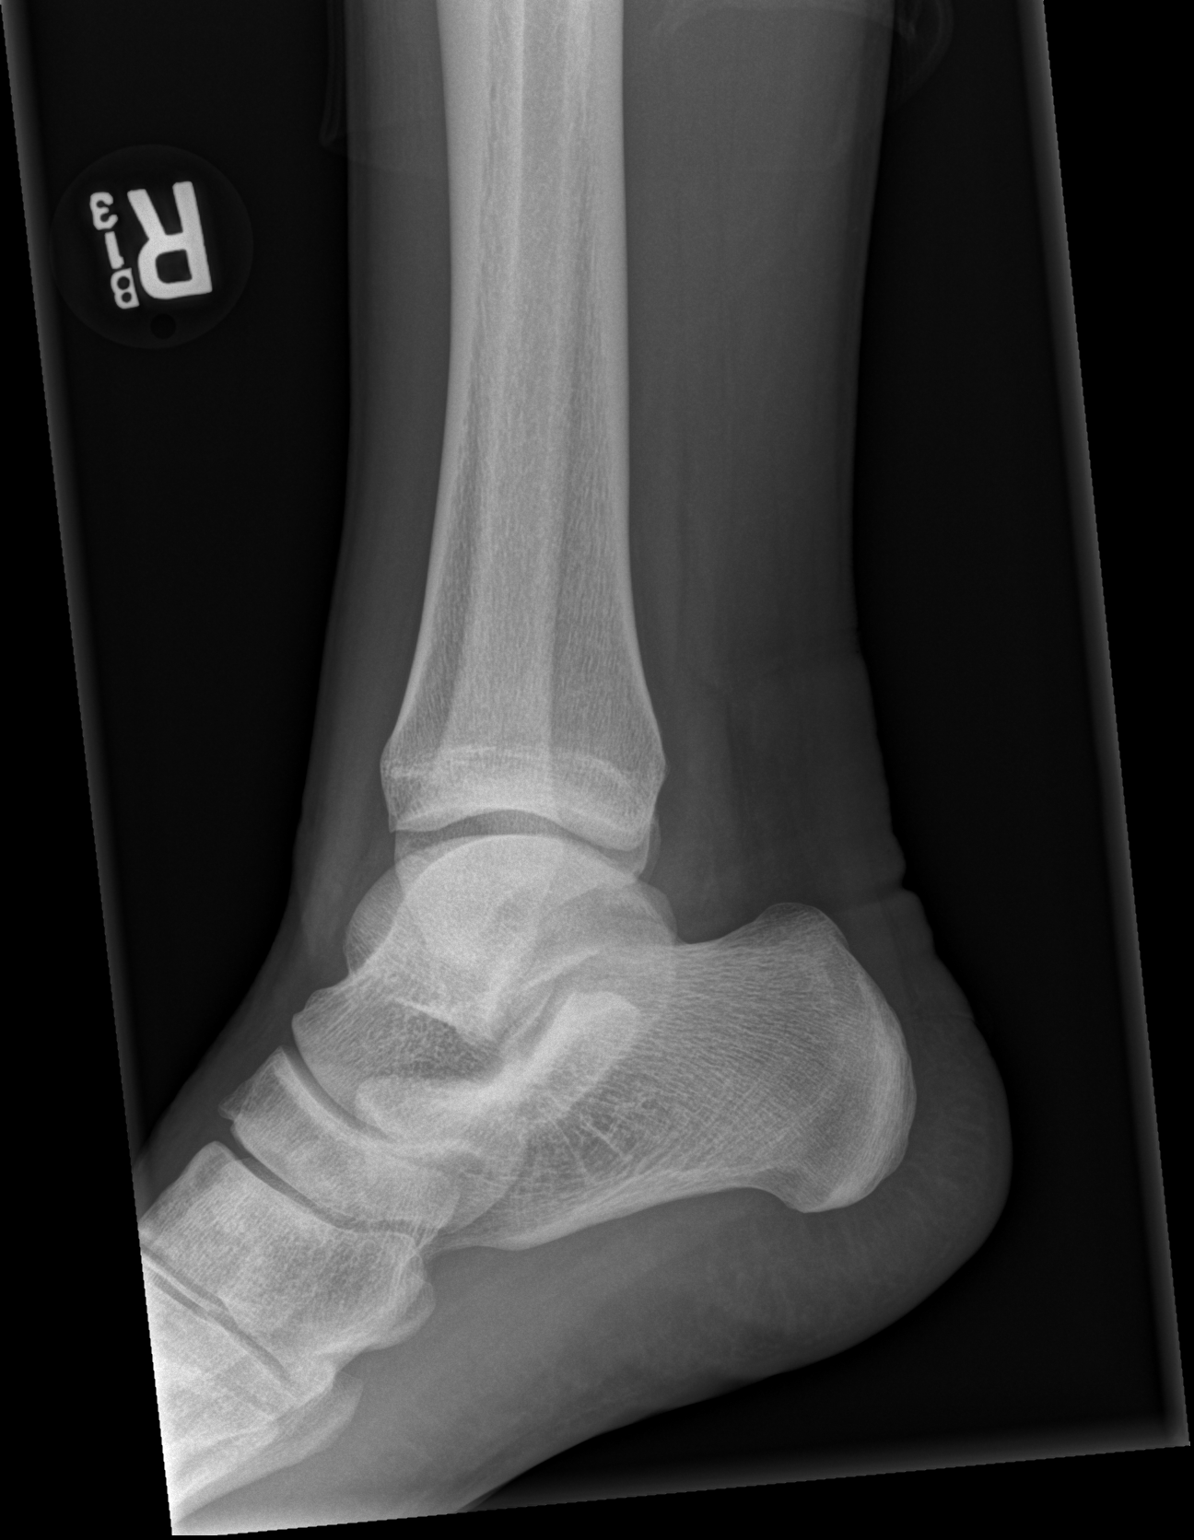

[3 of 3 positions shown; findings below may reference images not displayed]

FINDINGS: Ankle mortise intact. The talar dome is normal. No malleolar
fracture. The calcaneus is normal.
IMPRESSION: No fracture or dislocation.

## 2019-02-26 ENCOUNTER — Encounter (HOSPITAL_COMMUNITY): Payer: Self-pay

## 2019-02-26 ENCOUNTER — Other Ambulatory Visit: Payer: Self-pay

## 2019-02-26 ENCOUNTER — Ambulatory Visit (HOSPITAL_COMMUNITY)
Admission: EM | Admit: 2019-02-26 | Discharge: 2019-02-26 | Disposition: A | Discharge status: Home / Self Care | Payer: BLUE CROSS/BLUE SHIELD | Attending: Family Medicine | Admitting: Family Medicine

## 2019-02-26 DIAGNOSIS — Z720 Tobacco use: Secondary | ICD-10-CM | POA: Diagnosis not present

## 2019-02-26 DIAGNOSIS — J4541 Moderate persistent asthma with (acute) exacerbation: Secondary | ICD-10-CM | POA: Diagnosis not present

## 2019-02-26 MED ORDER — ALBUTEROL SULFATE HFA 108 (90 BASE) MCG/ACT IN AERS
2.0000 | INHALATION_SPRAY | Freq: Once | RESPIRATORY_TRACT | Status: AC
  Administered 2019-02-26: 11:00:00 2 via RESPIRATORY_TRACT

## 2019-02-26 MED ORDER — PREDNISONE 50 MG PO TABS: 50.0000 mg | ORAL_TABLET | Freq: Every day | ORAL | 0 refills | Status: DC

## 2019-02-26 MED ORDER — ALBUTEROL SULFATE (2.5 MG/3ML) 0.083% IN NEBU: 2.5000 mg | INHALATION_SOLUTION | Freq: Four times a day (QID) | RESPIRATORY_TRACT | 0 refills | Status: DC | PRN

## 2019-02-26 MED ORDER — METHYLPREDNISOLONE SODIUM SUCC 125 MG IJ SOLR
INTRAMUSCULAR | Status: AC
  Filled 2019-02-26: qty 2

## 2019-02-26 MED ORDER — ALBUTEROL SULFATE HFA 108 (90 BASE) MCG/ACT IN AERS
INHALATION_SPRAY | RESPIRATORY_TRACT | Status: AC
  Filled 2019-02-26: qty 6.7

## 2019-02-26 MED ORDER — ALBUTEROL SULFATE HFA 108 (90 BASE) MCG/ACT IN AERS: 1.0000 | INHALATION_SPRAY | Freq: Four times a day (QID) | RESPIRATORY_TRACT | 0 refills | Status: DC | PRN

## 2019-02-26 MED ORDER — FLUTICASONE PROPIONATE HFA 110 MCG/ACT IN AERO: 2.0000 | INHALATION_SPRAY | Freq: Two times a day (BID) | RESPIRATORY_TRACT | 0 refills | Status: DC

## 2019-02-26 MED ORDER — METHYLPREDNISOLONE SODIUM SUCC 125 MG IJ SOLR
80.0000 mg | Freq: Once | INTRAMUSCULAR | Status: AC
  Administered 2019-02-26: 80 mg via INTRAMUSCULAR

## 2019-02-26 NOTE — Discharge Instructions (Addendum)
Solumedrol injection in office today. Start prednisone as directed. Start flovent as directed. Albuterol as needed. Please refrain from smoking at this time. Follow up with PCP for further management of asthma control.   As discussed, currently symptoms most likely due to dust triggering asthma exacerbation. If develop cold symptoms such as cough, fever, chills, body aches, please self quarantine for 7 days since symptoms started AND more than 72 hours of no fever and cough prior to leaving the house. You can call COVID hotline [phone]) or use [facility] online if you have questions, or symptoms worsens to determine where you should seek care. If experiencing shortness of breath, trouble breathing, call 911 and provide them with your current situation.

## 2019-02-26 NOTE — ED Provider Notes (Signed)
[facility]    CSN: [id number] Arrival date & time: 02/26/19  1001     History   Chief Complaint Chief Complaint  Patient presents with  . Asthma    HPI Ethan Wood is a 28 y.o. male.   28 year old male comes in for few week history of asthma exacerbation. 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 He ran out of his maintenance inhaler a few months ago and has had more frequent exacerbation since. Current episode has been a few weeks and has been using albuterol with some relief. He ran out of medicine today and came in for evaluation. He has wheezing and shortness of breath that improves with albuterol inhaler. He denies cough, congestion, sore throat. Denies fever, chills, night sweats. Denies body aches. Denies sick contact. Current some day smoker.      Past Medical History:  Diagnosis Date  . Asthma     Patient Active Problem List   Diagnosis Date Noted  . Unspecified asthma(493.90) 04/07/2014  . Allergic rhinitis 04/07/2014    History reviewed. No pertinent surgical history.     Home Medications    Prior to Admission medications   Medication Sig Start Date End Date Taking? Authorizing Provider  albuterol (PROVENTIL) (2.5 MG/3ML) 0.083% nebulizer solution Take 3 mLs (2.5 mg total) by nebulization every 6 (six) hours as needed for wheezing or shortness of breath. 02/26/19   Cathie HoopsYu, Tylique Aull V, PA-C  albuterol (VENTOLIN HFA) 108 (90 Base) MCG/ACT inhaler Inhale 1-2 puffs into the lungs every 6 (six) hours as needed for wheezing or shortness of breath. 02/26/19   Cathie HoopsYu, Fae Blossom V, PA-C  fluticasone (FLOVENT HFA) 110 MCG/ACT inhaler Inhale 2 puffs into the lungs 2 (two) times daily. 02/26/19   Cathie HoopsYu, Aizley Stenseth V, PA-C  predniSONE (DELTASONE) 50 MG tablet Take 1 tablet (50 mg total) by mouth daily. 02/26/19   Belinda FisherYu, Rilyn Scroggs V, PA-C    Family History Family History  Problem Relation Age of Onset  . Hypertension Mother     Social History Social History    Tobacco Use  . Smoking status: Current Some Day Smoker    Packs/day: 0.25    Types: Cigarettes  . Smokeless tobacco: Never Used  . Tobacco comment: pt states 2-3 cigarettes per day  Substance Use Topics  . Alcohol use: No    Alcohol/week: 0.0 standard drinks    Comment: occ  . Drug use: Yes    Types: Marijuana     Allergies   Other   Review of Systems Review of Systems  Reason unable to perform ROS: See HPI as above.     Physical Exam Triage Vital Signs ED Triage Vitals  Enc Vitals Group     BP 02/26/19 1019 135/77     Pulse Rate 02/26/19 1019 66     Resp 02/26/19 1019 16     Temp 02/26/19 1019 98.6 F (37 C)     Temp Source 02/26/19 1019 Oral     SpO2 02/26/19 1019 100 %     Weight --      Height --      Head Circumference --      Peak Flow --      Pain Score 02/26/19 1033 0     Pain Loc --      Pain Edu? --      Excl. in GC? --    No data found.  Updated Vital Signs BP 135/77 (BP  Location: Left Arm)   Pulse 66   Temp 98.6 F (37 C) (Oral)   Resp 16   SpO2 100%   Visual Acuity Right Eye Distance:   Left Eye Distance:   Bilateral Distance:    Right Eye Near:   Left Eye Near:    Bilateral Near:     Physical Exam Constitutional:      General: He is not in acute distress.    Appearance: Normal appearance. He is not ill-appearing, toxic-appearing or diaphoretic.  HENT:     Head: Normocephalic and atraumatic.     Mouth/Throat:     Mouth: Mucous membranes are moist.     Pharynx: Oropharynx is clear. Uvula midline.  Neck:     Musculoskeletal: Normal range of motion and neck supple.  Cardiovascular:     Rate and Rhythm: Normal rate and regular rhythm.     Heart sounds: Normal heart sounds. No murmur. No friction rub. No gallop.   Pulmonary:     Effort: Pulmonary effort is normal. No accessory muscle usage, prolonged expiration, respiratory distress or retractions.     Comments: Diffuse inspiratory and expiratory wheezing, decreased air  movement.  Neurological:     General: No focal deficit present.     Mental Status: He is alert and oriented to person, place, and time.      UC Treatments / Results  Labs (all labs ordered are listed, but only abnormal results are displayed) Labs Reviewed - No data to display  EKG None  Radiology No results found.  Procedures Procedures (including critical care time)  Medications Ordered in UC Medications  albuterol (VENTOLIN HFA) 108 (90 Base) MCG/ACT inhaler 2 puff (2 puffs Inhalation Given 02/26/19 1111)  methylPREDNISolone sodium succinate (SOLU-MEDROL) 125 mg/2 mL injection 80 mg (80 mg Intramuscular Given 02/26/19 1111)    Initial Impression / Assessment and Plan / UC Course  I have reviewed the triage vital signs and the nursing notes.  Pertinent labs & imaging results that were available during my care of the patient were reviewed by me and considered in my medical decision making (see chart for details).    Unable to do DuoNeb treatments in office due to current COVID pandemic.  Albuterol inhaler 2 puffs given in office.  Patient with slight improvement of symptoms.  Improved air movement, still with mild inspiratory and expiratory wheezing.  Patient able to speak in full sentences without difficulty.  Solu-Medrol injection in office today.  Prednisone as directed.  Albuterol inhaler nebulizer refilled.  Flovent refilled.  Smoking cessation discussed.  Return precautions given.  Patient expresses understanding and agrees to plan.  Final Clinical Impressions(s) / UC Diagnoses   Final diagnoses:  Moderate persistent asthma with exacerbation    ED Prescriptions    Medication Sig Dispense Auth. Provider   fluticasone (FLOVENT HFA) 110 MCG/ACT inhaler Inhale 2 puffs into the lungs 2 (two) times daily. 1 Inhaler Rebacca Votaw V, PA-C   albuterol (VENTOLIN HFA) 108 (90 Base) MCG/ACT inhaler Inhale 1-2 puffs into the lungs every 6 (six) hours as needed for wheezing or shortness  of breath. 1 Inhaler Demri Poulton V, PA-C   predniSONE (DELTASONE) 50 MG tablet Take 1 tablet (50 mg total) by mouth daily. 5 tablet Tammy Ericsson V, PA-C   albuterol (PROVENTIL) (2.5 MG/3ML) 0.083% nebulizer solution Take 3 mLs (2.5 mg total) by nebulization every 6 (six) hours as needed for wheezing or shortness of breath. 75 mL Belinda Fisher, PA-C  Belinda Fisher, PA-C 02/26/19 1238

## 2019-02-26 NOTE — ED Triage Notes (Signed)
Patient presents to Urgent Care with complaints of asthma exacerbation and nasal congestion since "some time now with all the stuff I breathe in at work". Patient states he has used a nebulizer at home pta, also flovent. Pt is requesting albuterol inhaler refill.

## 2019-05-05 ENCOUNTER — Other Ambulatory Visit: Payer: Self-pay

## 2019-05-05 ENCOUNTER — Encounter (HOSPITAL_COMMUNITY): Payer: Self-pay

## 2019-05-05 DIAGNOSIS — J4521 Mild intermittent asthma with (acute) exacerbation: Secondary | ICD-10-CM | POA: Insufficient documentation

## 2019-05-05 DIAGNOSIS — F17210 Nicotine dependence, cigarettes, uncomplicated: Secondary | ICD-10-CM | POA: Diagnosis not present

## 2019-05-05 DIAGNOSIS — Z20828 Contact with and (suspected) exposure to other viral communicable diseases: Secondary | ICD-10-CM | POA: Insufficient documentation

## 2019-05-05 DIAGNOSIS — Z79899 Other long term (current) drug therapy: Secondary | ICD-10-CM | POA: Insufficient documentation

## 2019-05-05 DIAGNOSIS — Z760 Encounter for issue of repeat prescription: Secondary | ICD-10-CM | POA: Diagnosis not present

## 2019-05-05 DIAGNOSIS — R062 Wheezing: Secondary | ICD-10-CM | POA: Diagnosis present

## 2019-05-05 NOTE — ED Triage Notes (Signed)
Pt reports that he just got back from [facility] and would like to make sure that everything is "100." He states that he feels like he is coming down with a cold and wouold like a refill on his albuterol. He states that he was seen at a UC in [facility], but still feels like he's wheezing.

## 2019-05-06 ENCOUNTER — Emergency Department (HOSPITAL_COMMUNITY)
Admission: EM | Admit: 2019-05-06 | Discharge: 2019-05-06 | Disposition: A | Discharge status: Home / Self Care | Payer: BC Managed Care – PPO | Attending: Emergency Medicine | Admitting: Emergency Medicine

## 2019-05-06 DIAGNOSIS — J4521 Mild intermittent asthma with (acute) exacerbation: Secondary | ICD-10-CM

## 2019-05-06 MED ORDER — ALBUTEROL SULFATE HFA 108 (90 BASE) MCG/ACT IN AERS
8.0000 | INHALATION_SPRAY | RESPIRATORY_TRACT | Status: AC
  Administered 2019-05-06 (×2): 8 via RESPIRATORY_TRACT
  Filled 2019-05-06: qty 6.7

## 2019-05-06 MED ORDER — METHYLPREDNISOLONE SODIUM SUCC 125 MG IJ SOLR
125.0000 mg | Freq: Once | INTRAMUSCULAR | Status: AC
  Administered 2019-05-06: 125 mg via INTRAMUSCULAR
  Filled 2019-05-06: qty 2

## 2019-05-06 MED ORDER — ALBUTEROL SULFATE HFA 108 (90 BASE) MCG/ACT IN AERS: 2.0000 | INHALATION_SPRAY | RESPIRATORY_TRACT | 0 refills | Status: DC | PRN

## 2019-05-06 MED ORDER — ALBUTEROL SULFATE (2.5 MG/3ML) 0.083% IN NEBU: 2.5000 mg | INHALATION_SOLUTION | Freq: Four times a day (QID) | RESPIRATORY_TRACT | 12 refills | Status: DC | PRN

## 2019-05-06 NOTE — ED Notes (Signed)
Bed: WTR6 Expected date:  Expected time:  Means of arrival:  Comments: 

## 2019-05-06 NOTE — ED Provider Notes (Signed)
TIME SEEN: 12:59 AM  CHIEF COMPLAINT: Asthma exacerbation  HPI: Patient is a 28 year old male with history of asthma who presents to the emergency department with wheezing, nonproductive cough, chills.  [place] concerned he could have coronavirus.  States he would like testing.  He works for [employer]  He reports he is wheezing.  He needs an albuterol inhaler and medication for his nebulizer machine.  Was put on steroids 2 days ago.  No documented fever.  No vomiting or diarrhea.  No chest pain.  ROS: See HPI Constitutional: no fever  Eyes: no drainage  ENT: no runny nose   Cardiovascular:  no chest pain  Resp:  SOB  GI: no vomiting GU: no dysuria Integumentary: no rash  Allergy: no hives  Musculoskeletal: no leg swelling  Neurological: no slurred speech ROS otherwise negative  PAST MEDICAL HISTORY/PAST SURGICAL HISTORY:  Past Medical History:  Diagnosis Date  . Asthma     MEDICATIONS:  Prior to Admission medications   Medication Sig Start Date End Date Taking? Authorizing Provider  albuterol (PROVENTIL) (2.5 MG/3ML) 0.083% nebulizer solution Take 3 mLs (2.5 mg total) by nebulization every 6 (six) hours as needed for wheezing or shortness of breath. 02/26/19   Cathie HoopsYu, Amy V, PA-C  albuterol (VENTOLIN HFA) 108 (90 Base) MCG/ACT inhaler Inhale 1-2 puffs into the lungs every 6 (six) hours as needed for wheezing or shortness of breath. 02/26/19   Cathie HoopsYu, Amy V, PA-C  fluticasone (FLOVENT HFA) 110 MCG/ACT inhaler Inhale 2 puffs into the lungs 2 (two) times daily. 02/26/19   Cathie HoopsYu, Amy V, PA-C  predniSONE (DELTASONE) 50 MG tablet Take 1 tablet (50 mg total) by mouth daily. 02/26/19   Belinda FisherYu, Amy V, PA-C    ALLERGIES:  Allergies  Allergen Reactions  . Other Anaphylaxis    FRESh fruit and veggies    SOCIAL HISTORY:  Social History   Tobacco Use  . Smoking status: Current Some Day Smoker    Packs/day: 0.25    Types: Cigarettes  . Smokeless tobacco:  Never Used  . Tobacco comment: pt states 2-3 cigarettes per day  Substance Use Topics  . Alcohol use: No    Alcohol/week: 0.0 standard drinks    Comment: occ    FAMILY HISTORY: Family History  Problem Relation Age of Onset  . Hypertension Mother     EXAM: BP (!) 154/98 (BP Location: Right Arm)   Pulse 62   Temp 98.3 F (36.8 C) (Oral)   Resp 16   SpO2 100%  CONSTITUTIONAL: Alert and oriented and responds appropriately to questions. Well-appearing; well-nourished HEAD: Normocephalic EYES: Conjunctivae clear, pupils appear equal, EOMI ENT: normal nose; moist mucous membranes NECK: Supple, no meningismus, no nuchal rigidity, no LAD  CARD: RRR; S1 and S2 appreciated; no murmurs, no clicks, no rubs, no gallops RESP: Normal chest excursion without splinting or tachypnea; breath sounds equal bilaterally, he has inspiratory and expiratory wheezes, diminished aeration at bases bilaterally, no rhonchi or rales, no hypoxia or respiratory distress. ABD/GI: Normal bowel sounds; non-distended; soft, non-tender, no rebound, no guarding, no peritoneal signs, no hepatosplenomegaly BACK:  The back appears normal and is non-tender to palpation, there is no CVA tenderness EXT: Normal ROM in all joints; non-tender to palpation; no edema; normal capillary refill; no cyanosis, no calf tenderness or swelling    SKIN: Normal color for age and race; warm; no rash NEURO: Moves all extremities equally PSYCH: The patient's mood and  manner are appropriate. Grooming and personal hygiene are appropriate.  MEDICAL DECISION MAKING: Patient here with asthma exacerbation.  [place] was recently in [place] and is concerned he could have coronavirus.  Does not meet criteria for admission to the hospital.  He has no hypoxia or increased work of breathing.  Will give albuterol, IM Solu-Medrol and reassess.  Will send for outpatient coronavirus testing.  ED PROGRESS: Lungs now clear after albuterol treatment.  States he is  feeling much better.  He will follow-up in his coronavirus testing through my chart.  Recommended he stay at home until he has a test back.  Will provide work note.  He is aware that if he test positive he will need to quarantine for 14 days.  He has steroids for home and have recommended he continue these as prescribed.  We will refill his albuterol inhaler and nebulizers.   At this time, I do not feel there is any life-threatening condition present. I have reviewed and discussed all results (EKG, imaging, lab, urine as appropriate) and exam findings with patient/family. I have reviewed nursing notes and appropriate previous records.  I feel the patient is safe to be discharged home without further emergent workup and can continue workup as an outpatient as needed. Discussed usual and customary return precautions. Patient/family verbalize understanding and are comfortable with this plan.  Outpatient follow-up has been provided as needed. All questions have been answered.    Ethan Wood was evaluated in Emergency Department on 05/06/2019 for the symptoms described in the history of present illness. He was evaluated in the context of the global COVID-19 pandemic, which necessitated consideration that the patient might be at risk for infection with the SARS-CoV-2 virus that causes COVID-19. Institutional protocols and algorithms that pertain to the evaluation of patients at risk for COVID-19 are in a state of rapid change based on information released by regulatory bodies including the CDC and federal and state organizations. These policies and algorithms were followed during the patient's care in the ED.      , Delice Bison, DO 05/06/19 [phone]

## 2019-05-08 LAB — NOVEL CORONAVIRUS, NAA (HOSP ORDER, SEND-OUT TO REF LAB; TAT 18-24 HRS): SARS-CoV-2, NAA: NOT DETECTED

## 2019-06-04 ENCOUNTER — Encounter: Payer: Self-pay | Admitting: Family Medicine

## 2019-06-04 ENCOUNTER — Other Ambulatory Visit: Payer: Self-pay

## 2019-06-04 ENCOUNTER — Ambulatory Visit: Payer: BC Managed Care – PPO | Attending: Family Medicine | Admitting: Family Medicine

## 2019-06-04 VITALS — BP 122/80 | HR 65 | Temp 98.4°F | Ht 68.0 in | Wt 159.6 lb

## 2019-06-04 DIAGNOSIS — J3089 Other allergic rhinitis: Secondary | ICD-10-CM

## 2019-06-04 DIAGNOSIS — Z91018 Allergy to other foods: Secondary | ICD-10-CM

## 2019-06-04 DIAGNOSIS — J45901 Unspecified asthma with (acute) exacerbation: Secondary | ICD-10-CM | POA: Diagnosis not present

## 2019-06-04 DIAGNOSIS — Z720 Tobacco use: Secondary | ICD-10-CM

## 2019-06-04 DIAGNOSIS — J4530 Mild persistent asthma, uncomplicated: Secondary | ICD-10-CM

## 2019-06-04 MED ORDER — EPINEPHRINE 0.3 MG/0.3ML IJ SOAJ: 0.3000 mg | INTRAMUSCULAR | 99 refills | Status: DC | PRN

## 2019-06-04 MED ORDER — PREDNISONE 20 MG PO TABS: ORAL_TABLET | ORAL | 1 refills | Status: DC

## 2019-06-04 MED ORDER — ALBUTEROL SULFATE (2.5 MG/3ML) 0.083% IN NEBU: 2.5000 mg | INHALATION_SOLUTION | Freq: Four times a day (QID) | RESPIRATORY_TRACT | 12 refills | Status: DC | PRN

## 2019-06-04 MED ORDER — ALBUTEROL SULFATE HFA 108 (90 BASE) MCG/ACT IN AERS: INHALATION_SPRAY | RESPIRATORY_TRACT | 99 refills | Status: DC

## 2019-06-04 MED ORDER — FLOVENT HFA 110 MCG/ACT IN AERO: 2.0000 | INHALATION_SPRAY | Freq: Two times a day (BID) | RESPIRATORY_TRACT | 11 refills | Status: DC

## 2019-06-04 NOTE — Progress Notes (Signed)
New patient, med refills, asthma

## 2019-06-04 NOTE — Progress Notes (Signed)
Subjective:  Patient ID: Ethan Wood, male    DOB: 08/25/1991  Age: 28 y.o. MRN: [id number]  CC: New Patient (Initial Visit), Asthma, and Medication Refill   HPI Ethan Wood, 28 year old African-American male, who presents to establish care.  He reports a history of asthma but has been out of his medications for the past month.  He states that he works 2 different jobs and both are in [employer] which tend to be dusty.  He normally uses his inhaler at the end of each workday and has had some increased frequency of use of inhaler since being out of his Flovent.  He reports that he did have less chest congestion and sensation of shortness of breath with daily Flovent use.  He would also like to have a refill of albuterol nebulizer solution.  He has had some mild recent increased shortness of breath and wheezing.  He reports hospitalization x1 when he was in the fifth grade due to his asthma but since that time he has had no further hospitalizations or emergency department visits related to his asthma.  He also has issues with allergic rhinitis with occasional nasal congestion and sensation of postnasal drainage.  He does not like to take prescription antihistamines or use nasal sprays therefore he generally takes Benadryl as needed for symptoms of allergic rhinitis.  No current issues with nighttime awakening secondary to shortness of breath, cough, wheezing or chest tightness.  He reports that he did use his albuterol inhaler just prior to coming into the office for today's visit.       Patient reports that he does smoke about 5 cigarettes/day, usually after eating.  He knows that he should not be smoking especially since he also has asthma.  Patient reports that his only past surgical history involved resetting of his thumb for which he was put under anesthesia so that his thumb could be reset.  He did not have any issues with the anesthesia at that time.  Past medical history is significant for hypertension  in his mother, father and maternal grandmother.  Patient is not currently married but does have a 10146-year-old son.  Patient reports history of food allergy with throat swelling if he consumes fresh fruit, primarily citrus.  Patient reports that due to his concerns of allergic reactions with fresh fruits and possibly with fresh vegetables, he tends to eat precooked/processed foods which do not seem to bother him.  Past Medical History:  Diagnosis Date  . Asthma     Past Surgical History:  Procedure Laterality Date  . FINGER SURGERY      Family History  Problem Relation Age of Onset  . Hypertension Mother     Social History   Tobacco Use  . Smoking status: Current Some Day Smoker    Packs/day: 0.25    Types: Cigarettes  . Smokeless tobacco: Never Used  . Tobacco comment: pt states 2-3 cigarettes per day  Substance Use Topics  . Alcohol use: No    Alcohol/week: 0.0 standard drinks    Comment: occ    ROS Review of Systems  Constitutional: Negative for chills, fatigue and fever.  HENT: Positive for congestion and postnasal drip. Negative for sore throat and trouble swallowing.   Eyes: Negative for photophobia and visual disturbance.  Respiratory: Positive for shortness of breath (Mild) and wheezing (Mild). Negative for cough.   Gastrointestinal: Negative for abdominal pain, constipation, diarrhea and nausea.  Endocrine: Negative for cold intolerance, heat intolerance, polydipsia, polyphagia and polyuria.  Genitourinary: Negative for dysuria and frequency.  Musculoskeletal: Negative for arthralgias and back pain.  Allergic/Immunologic: Positive for environmental allergies and food allergies.  Neurological: Negative for dizziness and headaches.  Hematological: Negative for adenopathy. Does not bruise/bleed easily.  Psychiatric/Behavioral: Negative for sleep disturbance. The patient is not nervous/anxious.     Objective:   Today's Vitals: BP 122/80 (BP Location: Right Arm,  Patient Position: Sitting, Cuff Size: Normal)   Pulse 65   Temp 98.4 F (36.9 C) (Oral)   Ht 5\' 8"  (1.727 m)   Wt 159 lb 9.6 oz (72.4 kg)   SpO2 96%   BMI 24.27 kg/m   Physical Exam Vitals signs and nursing note reviewed.  Constitutional:      General: He is not in acute distress.    Appearance: Normal appearance. He is not ill-appearing.  HENT:     Head:     Comments: Left TM dull with subacute fluid level    Right Ear: Ear canal and external ear normal.     Left Ear: Ear canal and external ear normal. There is impacted cerumen.     Nose: Congestion and rhinorrhea (Edema of nasal turbinates and scant, clear nasal discharge) present.     Mouth/Throat:     Mouth: Mucous membranes are moist.     Pharynx: Oropharynx is clear. Posterior oropharyngeal erythema (Mild posterior pharynx erythema/cobblestoning) present.  Eyes:     Extraocular Movements: Extraocular movements intact.     Conjunctiva/sclera: Conjunctivae normal.  Neck:     Musculoskeletal: Normal range of motion and neck supple. No muscular tenderness.  Cardiovascular:     Rate and Rhythm: Normal rate and regular rhythm.  Pulmonary:     Effort: Pulmonary effort is normal. No respiratory distress.     Breath sounds: Wheezing present.     Comments: Mild decreased breath sounds/decreased air movement and mild expiratory wheeze in posterior lung fields Abdominal:     Palpations: Abdomen is soft.     Tenderness: There is no abdominal tenderness. There is no right CVA tenderness, left CVA tenderness, guarding or rebound.  Musculoskeletal:        General: No tenderness.     Right lower leg: No edema.     Left lower leg: No edema.  Lymphadenopathy:     Cervical: No cervical adenopathy.  Skin:    General: Skin is warm and dry.  Neurological:     General: No focal deficit present.     Mental Status: He is alert and oriented to person, place, and time.  Psychiatric:        Mood and Affect: Mood normal.        Behavior:  Behavior normal.     Assessment & Plan:  1. Mild persistent asthma without complication Patient reports that his asthma was better controlled with the daily use of Flovent and he was provided with refill at today's visit.  New prescriptions also given for albuterol inhaler to use as needed as well as albuterol nebulizer which can be used after work hours for more severe shortness of breath/wheezing or chest tightness.  Complete smoking cessation also encouraged as well as influenza immunization this fall. - albuterol (VENTOLIN HFA) 108 (90 Base) MCG/ACT inhaler; 2 to 4 puffs every 4-6 hours as needed for shortness of breath/wheezing  Dispense: 6.7 g; Refill: PRN - albuterol (PROVENTIL) (2.5 MG/3ML) 0.083% nebulizer solution; Take 3 mLs (2.5 mg total) by nebulization every 6 (six) hours as needed for wheezing or shortness of breath.  Dispense: 75 mL; Refill: 12 - fluticasone (FLOVENT HFA) 110 MCG/ACT inhaler; Inhale 2 puffs into the lungs 2 (two) times daily.  Dispense: 1 Inhaler; Refill: 11  2. Asthma exacerbation, mild Patient has been out of his daily Flovent for the past month.  Prescription provided for prednisone taper as he did have some wheezing at today's visit.  He was also provided with refills of Flovent and albuterol - predniSONE (DELTASONE) 20 MG tablet; Take 2 pills on the first day then 1 pill daily x2 days, then half pill daily x4 days.  Take after eating  Dispense: 6 tablet; Refill: 1  3. Non-seasonal allergic rhinitis, unspecified trigger Patient with allergic rhinitis and recurrent nasal congestion secondary to environmental factors including a dusty workplace.  Discussed possible use of daily Claritin/loratadine.  Patient states that he prefers to take Benadryl as needed and he is taking this medication for so long that he no longer has significant drowsiness with medication use.  4. Tobacco use Counseling provided regarding the need for complete smoking cessation and also  encouraged use of nicotine lozenges since he is only smoking per his report 4 to 5 cigarettes/day  5. 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 - EPINEPHrine 0.3 mg/0.3 mL IJ SOAJ injection; Inject 0.3 mLs (0.3 mg total) into the muscle as needed for anaphylaxis.  Dispense: 1 each; Refill: PRN  Outpatient Encounter Medications as of 06/04/2019  Medication Sig  . albuterol (PROVENTIL) (2.5 MG/3ML) 0.083% nebulizer solution Take 3 mLs (2.5 mg total) by nebulization every 6 (six) hours as needed for wheezing or shortness of breath.  Marland Kitchen albuterol (VENTOLIN HFA) 108 (90 Base) MCG/ACT inhaler 2 to 4 puffs every 4-6 hours as needed for shortness of breath/wheezing  . fluticasone (FLOVENT HFA) 110 MCG/ACT inhaler Inhale 2 puffs into the lungs 2 (two) times daily.  . [DISCONTINUED] albuterol (PROVENTIL) (2.5 MG/3ML) 0.083% nebulizer solution Take 3 mLs (2.5 mg total) by nebulization every 6 (six) hours as needed for wheezing or shortness of breath.  . [DISCONTINUED] albuterol (VENTOLIN HFA) 108 (90 Base) MCG/ACT inhaler Inhale 2 puffs into the lungs every 4 (four) hours as needed for wheezing or shortness of breath.  . [DISCONTINUED] fluticasone (FLOVENT HFA) 110 MCG/ACT inhaler Inhale 2 puffs into the lungs 2 (two) times daily.  Marland Kitchen EPINEPHrine 0.3 mg/0.3 mL IJ SOAJ injection Inject 0.3 mLs (0.3 mg total) into the muscle as needed for anaphylaxis.  . predniSONE (DELTASONE) 20 MG tablet Take 2 pills on the first day then 1 pill daily x2 days, then half pill daily x4 days.  Take after eating  . [DISCONTINUED] albuterol (PROVENTIL) (2.5 MG/3ML) 0.083% nebulizer solution Take 3 mLs (2.5 mg total) by nebulization every 6 (six) hours as needed for wheezing or shortness of breath. (Patient not taking: Reported on 06/04/2019)  . [DISCONTINUED] albuterol (VENTOLIN HFA) 108 (90 Base) MCG/ACT inhaler Inhale 1-2  puffs into the lungs every 6 (six) hours as needed for wheezing or shortness of breath. (Patient not taking: Reported on 06/04/2019)   No facility-administered encounter medications on file as of 06/04/2019.    An After Visit Summary was printed and given to the patient.   Follow-up: Return in about 5 months (around 11/04/2019) for asthma: 4-6 months and as needed.    Antony Blackbird MD

## 2019-06-04 NOTE — Patient Instructions (Signed)
Asthma, Adult  Asthma is a long-term (chronic) condition in which the airways get tight and narrow. The airways are the breathing passages that lead from the nose and mouth down into the lungs. A person with asthma will have times when symptoms get worse. These are called asthma attacks. They can cause coughing, whistling sounds when you breathe (wheezing), shortness of breath, and chest pain. They can make it hard to breathe. There is no cure for asthma, but medicines and lifestyle changes can help control it. There are many things that can bring on an asthma attack or make asthma symptoms worse (triggers). Common triggers include:  Mold.  Dust.  Cigarette smoke.  Cockroaches.  Things that can cause allergy symptoms (allergens). These include animal skin flakes (dander) and pollen from trees or grass.  Things that pollute the air. These may include household cleaners, wood smoke, smog, or chemical odors.  Cold air, weather changes, and wind.  Crying or laughing hard.  Stress.  Certain medicines or drugs.  Certain foods such as dried fruit, potato chips, and grape juice.  Infections, such as a cold or the flu.  Certain medical conditions or diseases.  Exercise or tiring activities. Asthma may be treated with medicines and by staying away from the things that cause asthma attacks. Types of medicines may include:  Controller medicines. These help prevent asthma symptoms. They are usually taken every day.  Fast-acting reliever or rescue medicines. These quickly relieve asthma symptoms. They are used as needed and provide short-term relief.  Allergy medicines if your attacks are brought on by allergens.  Medicines to help control the body's defense (immune) system. Follow these instructions at home: Avoiding triggers in your home  Change your heating and air conditioning filter often.  Limit your use of fireplaces and wood stoves.  Get rid of pests (such as roaches and  mice) and their droppings.  Throw away plants if you see mold on them.  Clean your floors. Dust regularly. Use cleaning products that do not smell.  Have someone vacuum when you are not home. Use a vacuum cleaner with a HEPA filter if possible.  Replace carpet with wood, tile, or vinyl flooring. Carpet can trap animal skin flakes and dust.  Use allergy-proof pillows, mattress covers, and box spring covers.  Wash bed sheets and blankets every week in hot water. Dry them in a dryer.  Keep your bedroom free of any triggers.  Avoid pets and keep windows closed when things that cause allergy symptoms are in the air.  Use blankets that are made of polyester or cotton.  Clean bathrooms and kitchens with bleach. If possible, have someone repaint the walls in these rooms with mold-resistant paint. Keep out of the rooms that are being cleaned and painted.  Wash your hands often with soap and water. If soap and water are not available, use hand sanitizer.  Do not allow anyone to smoke in your home. General instructions  Take over-the-counter and prescription medicines only as told by your doctor. ? Talk with your doctor if you have questions about how or when to take your medicines. ? Make note if you need to use your medicines more often than usual.  Do not use any products that contain nicotine or tobacco, such as cigarettes and e-cigarettes. If you need help quitting, ask your doctor.  Stay away from secondhand smoke.  Avoid doing things outdoors when allergen counts are high and when air quality is low.  Wear a ski mask   when doing outdoor activities in the winter. The mask should cover your nose and mouth. Exercise indoors on cold days if you can.  Warm up before you exercise. Take time to cool down after exercise.  Use a peak flow meter as told by your doctor. A peak flow meter is a tool that measures how well the lungs are working.  Keep track of the peak flow meter's readings.  Write them down.  Follow your asthma action plan. This is a written plan for taking care of your asthma and treating your attacks.  Make sure you get all the shots (vaccines) that your doctor recommends. Ask your doctor about a flu shot and a pneumonia shot.  Keep all follow-up visits as told by your doctor. This is important. Contact a doctor if:  You have wheezing, shortness of breath, or a cough even while taking medicine to prevent attacks.  The mucus you cough up (sputum) is thicker than usual.  The mucus you cough up changes from clear or white to yellow, green, gray, or bloody.  You have problems from the medicine you are taking, such as: ? A rash. ? Itching. ? Swelling. ? Trouble breathing.  You need reliever medicines more than 2-3 times a week.  Your peak flow reading is still at 50-79% of your personal best after following the action plan for 1 hour.  You have a fever. Get help right away if:  You seem to be worse and are not responding to medicine during an asthma attack.  You are short of breath even at rest.  You get short of breath when doing very little activity.  You have trouble eating, drinking, or talking.  You have chest pain or tightness.  You have a fast heartbeat.  Your lips or fingernails start to turn blue.  You are light-headed or dizzy, or you faint.  Your peak flow is less than 50% of your personal best.  You feel too tired to breathe normally. Summary  Asthma is a long-term (chronic) condition in which the airways get tight and narrow. An asthma attack can make it hard to breathe.  Asthma cannot be cured, but medicines and lifestyle changes can help control it.  Make sure you understand how to avoid triggers and how and when to use your medicines. This information is not intended to replace advice given to you by your health care provider. Make sure you discuss any questions you have with your health care provider. Document  Released: 04/03/2008 Document Revised: 12/19/2018 Document Reviewed: 11/20/2016 Elsevier Patient Education  2020 [employer]  A food allergy is when your body reacts to a food in a way that is not normal. The reaction can be mild or very bad. A very bad allergic reaction is called an anaphylactic reaction (anaphylaxis). A very bad reaction is an emergency. What are the causes? Common foods that can cause a reaction are:  Milk.  Eggs.  Peanuts.  Tree nuts. These include pecans, walnuts, and cashews.  Seafood.  Wheat.  Soy. What are the signs or symptoms? Signs of a mild reaction  Stuffy nose.  Tingling in the mouth.  An itchy, red rash.  Throwing up (vomiting).  Watery poop (diarrhea). Signs of a very bad reaction  Itchy, red, swollen areas of skin (hives).  Swelling of your: ? Eyes. ? Lips. ? Face. ? Mouth. ? Tongue. ? Throat.  Trouble with: ? Breathing. ? Talking. ? Swallowing.  Noisy breathing (wheezing).  Passing out (fainting).  Having any of these feelings: ? Warmth in your face (flushed). ? Dizziness. ? Light-headedness.  Pain in your belly. Follow these instructions at home: If you are being tested for an allergy:  Avoid foods as told by your doctor (elimination diet).  Write down what you eat and drink in a notebook (food diary). Each day, write: ? What you eat and drink and when. ? What problems you have and when. If you have a very bad allergy:   Wear a bracelet or necklace that says you have an allergy.  Carry your allergy kit (anaphylaxis kit) or an allergy shot (epinephrine injection) with you all the time. Use them as told by your doctor.  Make sure that you, your family, and your boss know: ? The signs of a very bad reaction. ? How to use your allergy kit. ? How to give an allergy shot.  If you use an allergy shot: ? Get more right away in case you have another reaction. ? Get help. You can have a  life-threatening reaction after taking the medicine (rebound anaphylaxis). General instructions  Avoid the foods that you are allergic to.  Read food labels. Look for ingredients that you are allergic to.  When you are at a restaurant, tell your server that you have an allergy. Ask if your meal has an ingredient that you are allergic to.  Take medicines only as told by your doctor. Do not drive until the medicine has worn off, unless your doctor says it is okay.  Tell all people who care for you that you have a food allergy. This includes your doctor and dentist.  If you think that you might be allergic to something else, talk with your doctor. Do not eat a food to see if you are allergic to it without talking with your doctor first. Contact a doctor if you:  Have signs of a reaction that have not gone away after 2 days.  Get worse.  Have new signs of a reaction. Get help right away if you have signs of a very bad reaction:  Itchy, red, swollen areas of skin.  Swelling of your: ? Eyes. ? Lips. ? Face. ? Mouth. ? Tongue. ? Throat.  Trouble with: ? Breathing. ? Talking. ? Swallowing.  Noisy breathing (wheezing).  Passing out (fainting).  Having any of these feelings: ? Warmth in your face (flushed). ? Dizziness. ? Light-headedness. ? Pain in your belly. These signs may be an emergency. Do not wait to see if the signs will go away. Use your allergy shot or allergy kit as you have been told. Get medical help right away. Call your local emergency services (911 in the [place] Do not drive yourself to the hospital. If you had to use your allergy pen, you must go to the emergency room even if the medicine seems to be working. This is important because another allergic reaction may happen within 3 days. Summary  A food allergy is when your body reacts to a food in a way that is not normal.  Avoid the foods that you are allergic to.  Wear a bracelet or necklace that says  you have an allergy.  Carry your allergy kit (anaphylaxis kit) or an allergy shot (epinephrine injection) with you all the time. Use them as told by your doctor. This information is not intended to replace advice given to you by your health care provider. Make sure you discuss any questions you have with  your health care provider. Document Released: 04/05/2010 Document Revised: 10/16/2017 Document Reviewed: 10/16/2017 Elsevier Patient Education  2020 [employer].

## 2019-07-10 IMAGING — CR DG CHEST 2V
2 series · 2 of 2 positions shown · non-contrast
Comparison: 02/03/2014.

CLINICAL DATA: Shortness of breath. Wheezing. History of asthma.
Smoker.

EXAM:
CHEST - 2 VIEW

[chest pa]
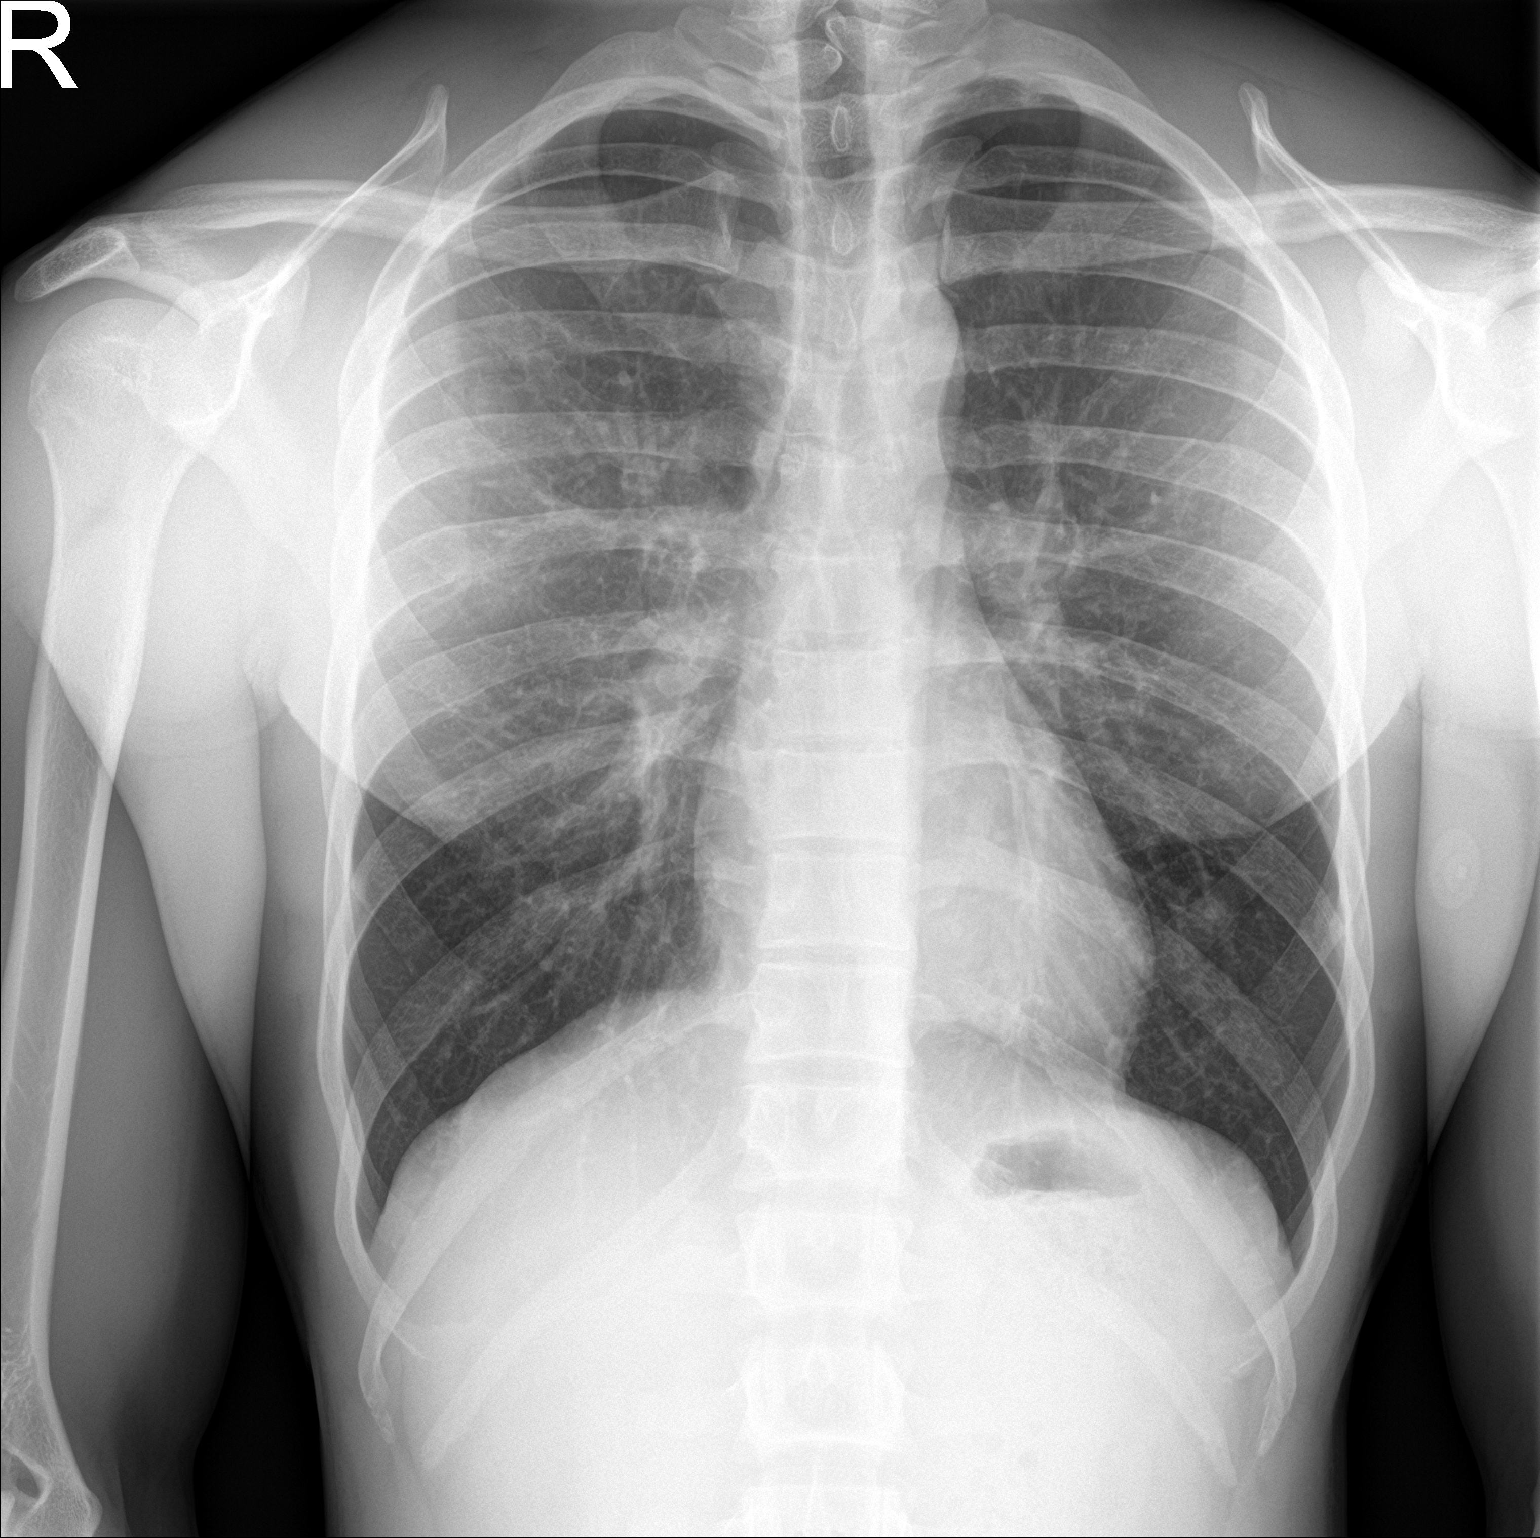

[chest lat]
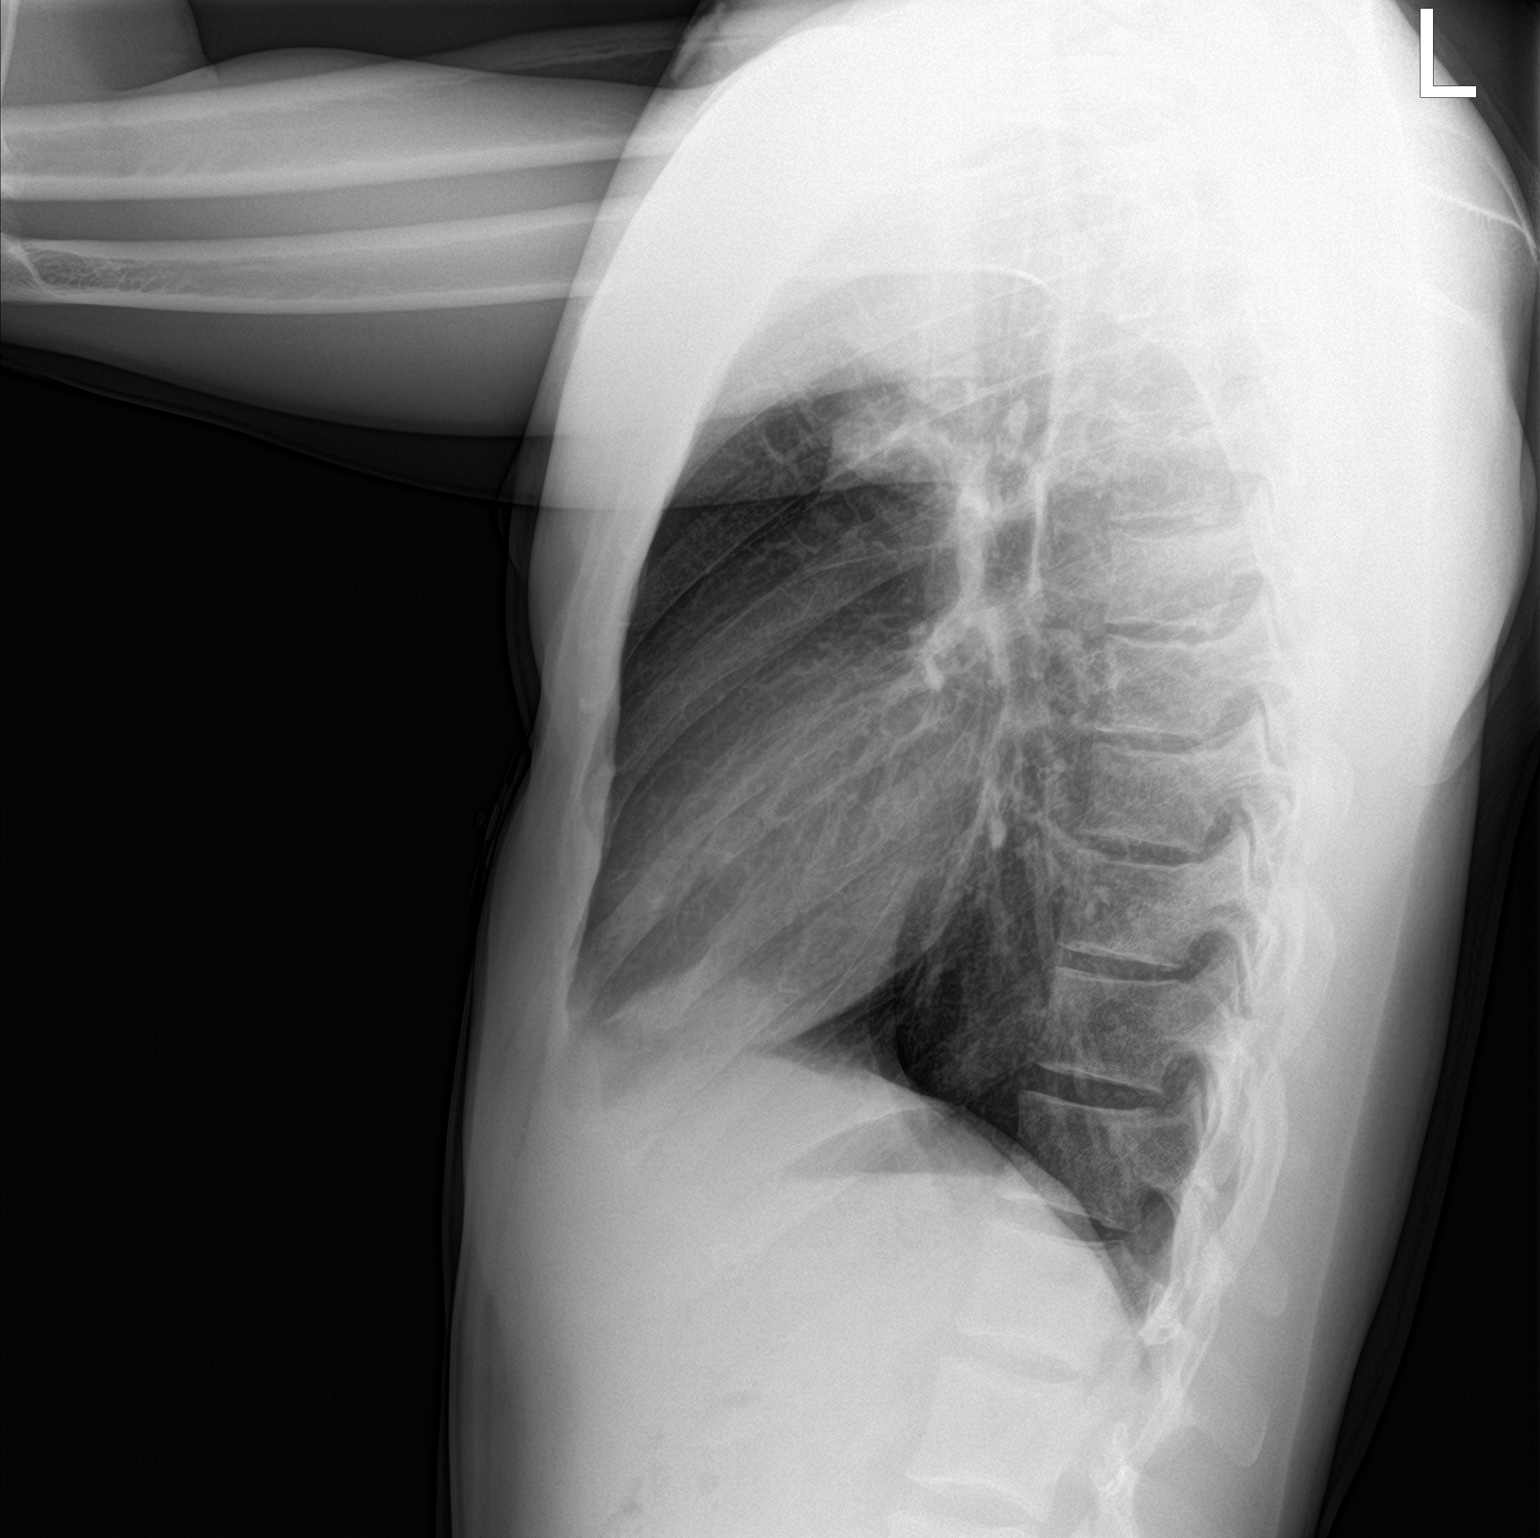

[2 of 2 positions shown; findings below may reference images not displayed]

FINDINGS: Normal sized heart. Clear lungs. The lungs are mildly hyperexpanded
with mild diffuse peribronchial thickening. Unremarkable bones.
IMPRESSION: Mild changes of COPD and chronic bronchitis.  No acute abnormality.

## 2019-07-26 ENCOUNTER — Other Ambulatory Visit: Payer: Self-pay | Admitting: Family Medicine

## 2019-07-26 DIAGNOSIS — J4530 Mild persistent asthma, uncomplicated: Secondary | ICD-10-CM

## 2019-11-11 ENCOUNTER — Ambulatory Visit: Payer: Self-pay | Attending: Internal Medicine

## 2019-11-11 DIAGNOSIS — Z20822 Contact with and (suspected) exposure to covid-19: Secondary | ICD-10-CM | POA: Insufficient documentation

## 2019-11-13 LAB — NOVEL CORONAVIRUS, NAA: SARS-CoV-2, NAA: NOT DETECTED

## 2023-08-13 ENCOUNTER — Ambulatory Visit
Admission: EM | Admit: 2023-08-13 | Discharge: 2023-08-13 | Disposition: A | Discharge status: Home / Self Care | Payer: BC Managed Care – PPO | Attending: Internal Medicine | Admitting: Internal Medicine

## 2023-08-13 ENCOUNTER — Other Ambulatory Visit: Payer: Self-pay

## 2023-08-13 ENCOUNTER — Encounter: Payer: Self-pay | Admitting: *Deleted

## 2023-08-13 DIAGNOSIS — J4530 Mild persistent asthma, uncomplicated: Secondary | ICD-10-CM

## 2023-08-13 DIAGNOSIS — J4521 Mild intermittent asthma with (acute) exacerbation: Secondary | ICD-10-CM

## 2023-08-13 DIAGNOSIS — R062 Wheezing: Secondary | ICD-10-CM

## 2023-08-13 MED ORDER — ALBUTEROL SULFATE HFA 108 (90 BASE) MCG/ACT IN AERS: 1.0000 | INHALATION_SPRAY | Freq: Four times a day (QID) | RESPIRATORY_TRACT | 0 refills | Status: DC | PRN

## 2023-08-13 MED ORDER — PREDNISONE 20 MG PO TABS: 40.0000 mg | ORAL_TABLET | Freq: Every day | ORAL | 0 refills | Status: AC

## 2023-08-13 MED ORDER — ALBUTEROL SULFATE (2.5 MG/3ML) 0.083% IN NEBU
2.5000 mg | INHALATION_SOLUTION | Freq: Once | RESPIRATORY_TRACT | Status: AC
  Administered 2023-08-13: 2.5 mg via RESPIRATORY_TRACT

## 2023-08-13 NOTE — Discharge Instructions (Signed)
I have refilled your albuterol inhaler and sent you prednisone for your asthma flareup.  Follow-up if any symptoms persist or worsen.

## 2023-08-13 NOTE — ED Triage Notes (Signed)
C/O feeling dyspnea and wheezing over past couple days with cough developing last night. States he normally gets these sxs yearly when working in [employer]. Denies fevers or rhinorrhea. STates he ran out of his inhalers.

## 2023-08-13 NOTE — ED Provider Notes (Signed)
[facility]    CSN: [id number] Arrival date & time: 08/13/23  0919      History   Chief Complaint Chief Complaint  Patient presents with   Shortness of Breath   Cough    HPI Ethan Wood is a 32 y.o. male.   Patient presents today with concerns of asthma exacerbation given he has been having shortness of breath, cough, wheezing over the past few days.  Reports that he ran out of his albuterol inhaler.  He used a friend's Symbicort inhaler as well.  Although, he denies that he has ever had to take any daily medications.  Reports that his asthma typically flares up with season changes.   Shortness of Breath Cough   Past Medical History:  Diagnosis Date   Asthma     Patient Active Problem List   Diagnosis Date Noted   Asthma 04/07/2014   Allergic rhinitis 04/07/2014    Past Surgical History:  Procedure Laterality Date   FINGER SURGERY         Home Medications    Prior to Admission medications   Medication Sig Start Date End Date Taking? Authorizing Provider  albuterol (VENTOLIN HFA) 108 (90 Base) MCG/ACT inhaler Inhale 1-2 puffs into the lungs every 6 (six) hours as needed for wheezing or shortness of breath. 08/13/23  Yes Quynh Basso, Rolly Salter E, FNP  predniSONE (DELTASONE) 20 MG tablet Take 2 tablets (40 mg total) by mouth daily for 5 days. 08/13/23 08/18/23 Yes Deane Melick, Rolly Salter E, FNP  albuterol (PROVENTIL) (2.5 MG/3ML) 0.083% nebulizer solution Take 3 mLs (2.5 mg total) by nebulization every 6 (six) hours as needed for wheezing or shortness of breath. 06/04/19   Fulp, Cammie, MD  albuterol (VENTOLIN HFA) 108 (90 Base) MCG/ACT inhaler 2 to 4 puffs every 4-6 hours as needed for shortness of breath/wheezing 06/04/19   Fulp, Cammie, MD  EPINEPHrine 0.3 mg/0.3 mL IJ SOAJ injection Inject 0.3 mLs (0.3 mg total) into the muscle as needed for anaphylaxis. 06/04/19   Cain Saupe, MD  FLOVENT HFA 110 MCG/ACT inhaler INHALE 2 PUFFS BY MOUTH TWICE A DAY 07/28/19   Fulp, Cammie,  MD    Family History Family History  Problem Relation Age of Onset   Hypertension Mother     Social History Social History   Tobacco Use   Smoking status: Former    Current packs/day: 0.25    Types: Cigarettes   Smokeless tobacco: Never   Tobacco comments:    pt states 2-3 cigarettes per day  Vaping Use   Vaping status: Never Used  Substance Use Topics   Alcohol use: Not Currently   Drug use: Yes    Types: Marijuana     Allergies   Other   Review of Systems Review of Systems Per HPI  Physical Exam Triage Vital Signs ED Triage Vitals  Encounter Vitals Group     BP 08/13/23 0932 (!) 145/82     Systolic BP Percentile --      Diastolic BP Percentile --      Pulse Rate 08/13/23 0932 (!) 57     Resp 08/13/23 0932 (!) 22     Temp 08/13/23 0932 98.1 F (36.7 C)     Temp Source 08/13/23 0932 Oral     SpO2 08/13/23 0932 95 %     Weight --      Height --      Head Circumference --      Peak Flow --  Pain Score 08/13/23 0933 0     Pain Loc --      Pain Education --      Exclude from Growth Chart --    No data found.  Updated Vital Signs BP (!) 145/82   Pulse (!) 57   Temp 98.1 F (36.7 C) (Oral)   Resp (!) 22   SpO2 95%   Visual Acuity Right Eye Distance:   Left Eye Distance:   Bilateral Distance:    Right Eye Near:   Left Eye Near:    Bilateral Near:     Physical Exam Constitutional:      General: He is not in acute distress.    Appearance: Normal appearance. He is not toxic-appearing or diaphoretic.  HENT:     Head: Normocephalic and atraumatic.  Eyes:     Extraocular Movements: Extraocular movements intact.     Conjunctiva/sclera: Conjunctivae normal.  Cardiovascular:     Rate and Rhythm: Normal rate and regular rhythm.     Pulses: Normal pulses.     Heart sounds: Normal heart sounds.  Pulmonary:     Effort: Pulmonary effort is normal. No respiratory distress.     Breath sounds: Wheezing present.  Neurological:     General: No  focal deficit present.     Mental Status: He is alert and oriented to person, place, and time. Mental status is at baseline.  Psychiatric:        Mood and Affect: Mood normal.        Behavior: Behavior normal.        Thought Content: Thought content normal.        Judgment: Judgment normal.      UC Treatments / Results  Labs (all labs ordered are listed, but only abnormal results are displayed) Labs Reviewed - No data to display  EKG   Radiology No results found.  Procedures Procedures (including critical care time)  Medications Ordered in UC Medications  albuterol (PROVENTIL) (2.5 MG/3ML) 0.083% nebulizer solution 2.5 mg (2.5 mg Nebulization Given 08/13/23 1134)    Initial Impression / Assessment and Plan / UC Course  I have reviewed the triage vital signs and the nursing notes.  Pertinent labs & imaging results that were available during my care of the patient were reviewed by me and considered in my medical decision making (see chart for details).     Patient appears to be having a mild asthma exacerbation.  Oxygen is normal and patient is not tachypneic so do not think that emergent evaluation is necessary.  Albuterol nebulizer treatment was administered today with patient stating that he felt much better.  Will treat with prednisone steroid burst and patient's albuterol inhaler was refilled.  Advised strict follow-up precautions if any symptoms persist or worsen.  Patient verbalized understanding and was agreeable with plan. Final Clinical Impressions(s) / UC Diagnoses   Final diagnoses:  Mild intermittent asthma with acute exacerbation     Discharge Instructions      I have refilled your albuterol inhaler and sent you prednisone for your asthma flareup.  Follow-up if any symptoms persist or worsen.     ED Prescriptions     Medication Sig Dispense Auth. Provider   albuterol (VENTOLIN HFA) 108 (90 Base) MCG/ACT inhaler Inhale 1-2 puffs into the lungs every  6 (six) hours as needed for wheezing or shortness of breath. 1 each [place], Rolly Salter E, [place]   predniSONE (DELTASONE) 20 MG tablet Take 2 tablets (40 mg total) by  mouth daily for 5 days. 10 tablet Gustavus Bryant, [place]      PDMP not reviewed this encounter.   Gustavus Bryant, [place] 08/13/23 1250

## 2023-09-10 ENCOUNTER — Ambulatory Visit: Payer: BC Managed Care – PPO | Admitting: Family Medicine

## 2023-09-10 ENCOUNTER — Encounter: Payer: Self-pay | Admitting: Family Medicine

## 2023-09-10 VITALS — BP 118/78 | HR 60 | Temp 98.2°F | Ht 71.0 in | Wt 165.1 lb

## 2023-09-10 DIAGNOSIS — L2082 Flexural eczema: Secondary | ICD-10-CM

## 2023-09-10 DIAGNOSIS — Z87891 Personal history of nicotine dependence: Secondary | ICD-10-CM | POA: Insufficient documentation

## 2023-09-10 DIAGNOSIS — F17200 Nicotine dependence, unspecified, uncomplicated: Secondary | ICD-10-CM | POA: Insufficient documentation

## 2023-09-10 DIAGNOSIS — Z23 Encounter for immunization: Secondary | ICD-10-CM | POA: Diagnosis not present

## 2023-09-10 DIAGNOSIS — J3089 Other allergic rhinitis: Secondary | ICD-10-CM | POA: Diagnosis not present

## 2023-09-10 DIAGNOSIS — Z91018 Allergy to other foods: Secondary | ICD-10-CM | POA: Diagnosis not present

## 2023-09-10 DIAGNOSIS — L309 Dermatitis, unspecified: Secondary | ICD-10-CM | POA: Insufficient documentation

## 2023-09-10 DIAGNOSIS — J4530 Mild persistent asthma, uncomplicated: Secondary | ICD-10-CM | POA: Diagnosis not present

## 2023-09-10 DIAGNOSIS — IMO0001 Reserved for inherently not codable concepts without codable children: Secondary | ICD-10-CM

## 2023-09-10 MED ORDER — ALBUTEROL SULFATE HFA 108 (90 BASE) MCG/ACT IN AERS: INHALATION_SPRAY | RESPIRATORY_TRACT | 5 refills | Status: DC

## 2023-09-10 MED ORDER — EPINEPHRINE 0.3 MG/0.3ML IJ SOAJ: 0.3000 mg | INTRAMUSCULAR | 1 refills | Status: AC | PRN

## 2023-09-10 MED ORDER — ALBUTEROL SULFATE (2.5 MG/3ML) 0.083% IN NEBU: 2.5000 mg | INHALATION_SOLUTION | Freq: Four times a day (QID) | RESPIRATORY_TRACT | 1 refills | Status: AC | PRN

## 2023-09-10 MED ORDER — FLUTICASONE PROPIONATE HFA 110 MCG/ACT IN AERO: 2.0000 | INHALATION_SPRAY | Freq: Two times a day (BID) | RESPIRATORY_TRACT | 5 refills | Status: DC

## 2023-09-10 NOTE — Patient Instructions (Addendum)
Smoking is worsening asthma  Consider getting nicotine gum as an option to quit   I sent prescription to the pharmacy , if any issues with coverage they can let me know   The inhaled steroid is to prevent asthma attacks (start back on flovent or covered alternative)  Albuterol is to treat them   Take care of yourself   Keep the epi pen with you    I put the referral in for allergist  Please let us know if you don't hear in 1-2 weeks     Tdap vaccine today

## 2023-09-10 NOTE — Assessment & Plan Note (Signed)
No prescription medicines for this currently

## 2023-09-10 NOTE — Assessment & Plan Note (Signed)
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

## 2023-09-10 NOTE — Assessment & Plan Note (Signed)
Per pt almost all fresh fruits and vegetables cause allergic reaction (throat itching and swelling) Carries epi pen Has never been allergy tested (worrisome)  Allergist referral today  Need to identify what he can safely eat Currently exists on all processed foods -not healthy

## 2023-09-10 NOTE — Assessment & Plan Note (Signed)
Disc in detail risks of smoking and possible outcomes including copd, vascular/ heart disease, cancer , respiratory and sinus infections  Pt voices understanding 1-2 cig per day  Not ready to quit  Discussed future use of nicotine gum to help quit  Aware this is worsening his asthma

## 2023-09-10 NOTE — Assessment & Plan Note (Addendum)
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

## 2023-09-10 NOTE — Progress Notes (Signed)
Subjective:    Patient ID: Ethan Wood, male    DOB: November 20, 1990, 32 y.o.   MRN: [id number]  HPI  Wt Readings from Last 3 Encounters:  09/10/23 165 lb 2 oz (74.9 kg)  06/04/19 159 lb 9.6 oz (72.4 kg)  04/20/18 175 lb (79.4 kg)   23.03 kg/m  Vitals:   09/10/23 1527  BP: 118/78  Pulse: 60  Temp: 98.2 F (36.8 C)  SpO2: 98%   Pt presents to establish for primary care  No recent pcp Goes to UC when needed   Having asthma issues and needs refills  Mild/ intermittent   Asthma symptoms  Wheezing/ tight  Then coughing  More mucous   Worse during season change Also dust    Also allergic rhinitis   Also history of food allergies    Had to go to ER for exacerbation on 10/14 and needed albuterol nmt treatment  This helped  Was also treated with steroid burst   Flovent is noted in past 110 mcg 2 puffs bid  Out for a while  It really helped   Works at [employer]  Has never been allergy tested   He is sensitive to some fruits [employer]  Citrus   He eats processed foods mostly due to this  Carries epi pen   If he eats a salad he gets symptoms also   Nasal allergies - benadryl occasionally  The other antihistamines do not work as well  Steroid ns do not work well    Albuterol mdi - the days he works 2-3 times per day  Albuterol NMT solution -only uses when he is in the middle of an attack   Flovent - will use twice daily when he has it  Really helps    Smoking status  1-2 cigarettes per day    Declines flu shot   May be open to tetanus shot   Mother's health is good  Does not know father's side   GM had HTN   One hospitalization for asthma as a child  No intubation at all   Needs FMLA for his asthma  Works 5-7 days per week  He mises 2-3 days per week (during the worse season which is 6-7 months)   Works at [employer] it  Very dusty warehouse   Son has allergies/asthma Chesley Noon also   Has 33yo boy  and 7 month old  girl  Is due for Tdap vaccine   Patient Active Problem List   Diagnosis Date Noted   Multiple food allergies 09/10/2023   Eczema 09/10/2023   Smoking 09/10/2023   Asthma 04/07/2014   Allergic rhinitis 04/07/2014   Past Medical History:  Diagnosis Date   Asthma    Past Surgical History:  Procedure Laterality Date   FINGER SURGERY     Social History   Tobacco Use   Smoking status: Former    Current packs/day: 0.25    Types: Cigarettes   Smokeless tobacco: Never   Tobacco comments:    pt states 2-3 cigarettes per day  Vaping Use   Vaping status: Never Used  Substance Use Topics   Alcohol use: Not Currently   Drug use: Yes    Types: Marijuana   Family History  Problem Relation Age of Onset   Hypertension Mother    Dementia Maternal Grandmother    Allergies  Allergen Reactions   Other Anaphylaxis    FRESh fruit and veggies   No current outpatient medications  on file prior to visit.   No current facility-administered medications on file prior to visit.    Review of Systems  Constitutional:  Negative for activity change, appetite change, fatigue, fever and unexpected weight change.  HENT:  Positive for postnasal drip, rhinorrhea and sneezing. Negative for congestion, sore throat and trouble swallowing.   Eyes:  Negative for pain, redness, itching and visual disturbance.  Respiratory:  Positive for cough, shortness of breath and wheezing. Negative for chest tightness.   Cardiovascular:  Negative for chest pain and palpitations.  Gastrointestinal:  Negative for abdominal pain, blood in stool, constipation, diarrhea and nausea.  Endocrine: Negative for cold intolerance, heat intolerance, polydipsia and polyuria.  Genitourinary:  Negative for difficulty urinating, dysuria, frequency and urgency.  Musculoskeletal:  Negative for arthralgias, joint swelling and myalgias.  Skin:  Negative for pallor and rash.       Eczema   Neurological:  Negative for dizziness,  tremors, weakness, numbness and headaches.  Hematological:  Negative for adenopathy. Does not bruise/bleed easily.  Psychiatric/Behavioral:  Negative for decreased concentration and dysphoric mood. The patient is not nervous/anxious.        Objective:   Physical Exam Constitutional:      General: He is not in acute distress.    Appearance: Normal appearance. He is well-developed and normal weight. He is not ill-appearing or diaphoretic.     Comments: Muscular build   HENT:     Head: Normocephalic and atraumatic.     Right Ear: Tympanic membrane and ear canal normal.     Left Ear: Tympanic membrane and ear canal normal.     Nose: Rhinorrhea present.     Comments: Boggy nares  Eyes:     General:        Right eye: No discharge.        Left eye: No discharge.     Conjunctiva/sclera: Conjunctivae normal.     Pupils: Pupils are equal, round, and reactive to light.  Neck:     Thyroid: No thyromegaly.     Vascular: No carotid bruit or JVD.  Cardiovascular:     Rate and Rhythm: Normal rate and regular rhythm.     Heart sounds: Normal heart sounds.     No gallop.  Pulmonary:     Effort: Pulmonary effort is normal. No respiratory distress.     Breath sounds: No stridor. Wheezing present. No rhonchi or rales.     Comments: Wheeze on forced exp and at end of deep breaths  Mildly prolonged exp phase   No rales  Abdominal:     General: There is no distension or abdominal bruit.     Palpations: Abdomen is soft.  Musculoskeletal:     Cervical back: Normal range of motion and neck supple.     Right lower leg: No edema.     Left lower leg: No edema.  Lymphadenopathy:     Cervical: No cervical adenopathy.  Skin:    General: Skin is warm and dry.     Coloration: Skin is not pale.     Findings: No rash.  Neurological:     Mental Status: He is alert.     Cranial Nerves: No cranial nerve deficit.     Coordination: Coordination normal.     Deep Tendon Reflexes: Reflexes are normal and  symmetric. Reflexes normal.  Psychiatric:        Mood and Affect: Mood normal.           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       Relevant Orders   Ambulatory referral to Allergy   Asthma - Primary    Intermittent, moderate Not optimally controlled Has been w/o care and needs flovent and albuterol refilled  Also smokes 1-2 cig per day/encouraged to quit   Last ER visit reviewed from 10/14   - treatment with albuterol nmt and steroids  On exam-wheezing noted with forced exp and deeper breaths  Mildly prolonged exp phase   Triggered usually by allergies (no problems with exercise)  Dusty warehouse and change in season  Has not been allergy tested Misses up to 3-4 days of work per week  I expect back on mt medication this will improve much (FMLA paperwork done today)  With this in addn to nasal allergies/eczema and food allergies- discussed need for allergist attention and testing Referral done        Relevant Medications   albuterol (PROVENTIL) (2.5 MG/3ML) 0.083% nebulizer solution   albuterol (VENTOLIN HFA) 108 (90 Base) MCG/ACT inhaler   fluticasone (FLOVENT HFA) 110 MCG/ACT inhaler   Other Relevant Orders   Ambulatory referral to Allergy     Musculoskeletal and Integument   Eczema    No prescription medicines for this currently        Other   Multiple food allergies    Per pt almost all fresh fruits and vegetables cause allergic reaction (throat itching and swelling) Carries epi pen Has never been allergy tested (worrisome)  Allergist referral today  Need to identify what he can safely eat Currently exists on all processed foods -not healthy      Relevant Orders   Ambulatory referral to Allergy   Smoking    Disc in detail risks of smoking and possible outcomes including copd,  vascular/ heart disease, cancer , respiratory and sinus infections  Pt voices understanding 1-2 cig per day  Not ready to quit  Discussed future use of nicotine gum to help quit  Aware this is worsening his asthma      Other Visit Diagnoses     Hx of food allergy       Relevant Medications   EPINEPHrine 0.3 mg/0.3 mL IJ SOAJ injection   Need for Tdap vaccination       Relevant Orders   Tdap vaccine greater than or equal to 7yo IM (Completed)

## 2023-09-11 ENCOUNTER — Other Ambulatory Visit: Payer: Self-pay | Admitting: *Deleted

## 2023-09-11 MED ORDER — ARNUITY ELLIPTA 100 MCG/ACT IN AEPB: 100.0000 ug | INHALATION_SPRAY | Freq: Every day | RESPIRATORY_TRACT | 5 refills | Status: DC

## 2023-09-11 NOTE — Telephone Encounter (Signed)
Received fax from [facility] saying that pt's Flovent isn't covered by insurance but the alt that they do cover is:  Arnuity Elpt inj  They request a new Rx with alt med be sent to them

## 2023-09-11 NOTE — Telephone Encounter (Signed)
Pt.notified

## 2023-09-11 NOTE — Telephone Encounter (Signed)
Please let him know this medicine does the same as flovent  Let us know if any issues or if not helpful  Use is once daily (more convenient)

## 2023-11-04 NOTE — Progress Notes (Deleted)
 New Patient Note  RE: Ethan Wood MRN: [id number] DOB: 1991-09-08 Date of Office Visit: 11/05/2023  Consult requested by: Tower, Laine LABOR, MD Primary care provider: Randeen Laine LABOR, MD  Chief Complaint: No chief complaint on file.  History of Present Illness: I had the pleasure of seeing Ethan Wood for initial evaluation at the [facility] on 11/04/2023. He is a 33 y.o. male, who is referred here by Randeen Laine LABOR, MD for the evaluation of asthma, allergic rhinitis and food allergies.  Discussed the use of AI scribe software for clinical note transcription with the patient, who gave verbal consent to proceed.  History of Present Illness             He reports symptoms of *** chest tightness, shortness of breath, coughing, wheezing, nocturnal awakenings for *** years. Current medications include *** which help. He reports *** using aerochamber with inhalers. He tried the following inhalers: ***. Main triggers are ***allergies, infections, weather changes, smoke, exercise, pet exposure. In the last month, frequency of symptoms: ***x/week. Frequency of nocturnal symptoms: ***x/month. Frequency of SABA use: ***x/week. Interference with physical activity: ***. Sleep is ***disturbed. In the last 12 months, emergency room visits/urgent care visits/doctor office visits or hospitalizations due to respiratory issues: ***. In the last 12 months, oral steroids courses: ***. Lifetime history of hospitalization for respiratory issues: ***. Prior intubations: ***. Asthma was diagnosed at age *** by ***. History of pneumonia: ***. He was evaluated by allergist ***pulmonologist in the past. Smoking exposure: ***. Up to date with flu vaccine: ***. Up to date with pneumonia vaccine: ***. Up to date with COVID-19 vaccine: ***. Prior Covid-19 infection: ***. History of reflux: ***.  He reports symptoms of ***. Symptoms have been going on for *** years. The symptoms are present *** all year around  with worsening in ***. Other triggers include exposure to ***. Anosmia: ***. Headache: ***. He has used *** with ***fair improvement in symptoms. Sinus infections: ***. Previous work up includes: ***. Previous ENT evaluation: ***. Previous sinus imaging: ***. History of nasal polyps: ***. Last eye exam: ***. History of reflux: ***.  He reports food allergy  to ***. The reaction occurred at the age of ***, after he ate *** amount of ***. Symptoms started within *** and was in the form of *** hives, swelling, wheezing, abdominal pain, diarrhea, vomiting. ***Denies any associated cofactors such as exertion, infection, NSAID use, or alcohol consumption. The symptoms lasted for ***. He was evaluated in ED and received ***. Since this episode, he does *** not report other accidental exposures to ***. He does *** not have access to epinephrine  autoinjector and *** needed to use it.   Past work up includes: ***. Dietary History: patient has been eating other foods including ***milk, ***eggs, ***peanut, ***treenuts, ***sesame, ***shellfish, ***fish, ***soy, ***wheat, ***meats, ***fruits and ***vegetables.  He reports reading labels and avoiding *** in diet completely. He tolerates ***baked egg and baked milk products.   Assessment and Plan: Ethan Wood is a 33 y.o. male with: ***  Assessment and Plan               No follow-ups on file.  No orders of the defined types were placed in this encounter.  Lab Orders  No laboratory test(s) ordered today    Other allergy  screening: Asthma: {Blank single:19197::yes,no} Rhino conjunctivitis: {Blank single:19197::yes,no} Food allergy : {Blank single:19197::yes,no} Medication allergy : {Blank single:19197::yes,no} Hymenoptera allergy : {Blank single:19197::yes,no} Urticaria: {Blank single:19197::yes,no} Eczema:{Blank single:19197::yes,no} History of recurrent  infections suggestive of immunodeficency: {Blank  single:19197::yes,no}  Diagnostics: Spirometry:  Tracings reviewed. His effort: {Blank single:19197::Good reproducible efforts.,It was hard to get consistent efforts and there is a question as to whether this reflects a maximal maneuver.,Poor effort, data can not be interpreted.} FVC: ***L FEV1: ***L, ***% predicted FEV1/FVC ratio: ***% Interpretation: {Blank single:19197::Spirometry consistent with mild obstructive disease,Spirometry consistent with moderate obstructive disease,Spirometry consistent with severe obstructive disease,Spirometry consistent with possible restrictive disease,Spirometry consistent with mixed obstructive and restrictive disease,Spirometry uninterpretable due to technique,Spirometry consistent with normal pattern,No overt abnormalities noted given today's efforts}.  Please see scanned spirometry results for details.  Skin Testing: {Blank single:19197::Select foods,Environmental allergy  panel,Environmental allergy  panel and select foods,Food allergy  panel,None,Deferred due to recent antihistamines use}. *** Results discussed with patient/family.   Past Medical History: Patient Active Problem List   Diagnosis Date Noted  . Multiple food allergies 09/10/2023  . Eczema 09/10/2023  . Smoking 09/10/2023  . Asthma 04/07/2014  . Allergic rhinitis 04/07/2014   Past Medical History:  Diagnosis Date  . Asthma    Past Surgical History: Past Surgical History:  Procedure Laterality Date  . FINGER SURGERY     Medication List:  Current Outpatient Medications  Medication Sig Dispense Refill  . albuterol  (PROVENTIL ) (2.5 MG/3ML) 0.083% nebulizer solution Take 3 mLs (2.5 mg total) by nebulization every 6 (six) hours as needed for wheezing or shortness of breath. 75 mL 1  . albuterol  (VENTOLIN  HFA) 108 (90 Base) MCG/ACT inhaler 2 to 4 puffs every 4-6 hours as needed for shortness of breath/wheezing 18 g 5  . EPINEPHrine  0.3 mg/0.3 mL  IJ SOAJ injection Inject 0.3 mg into the muscle as needed for anaphylaxis. 1 each 1  . Fluticasone  Furoate (ARNUITY ELLIPTA ) 100 MCG/ACT AEPB Inhale 100 mcg into the lungs daily. 30 each 5   No current facility-administered medications for this visit.   Allergies: Allergies  Allergen Reactions  . Other Anaphylaxis    FRESh fruit and veggies   Social History: Social History   Socioeconomic History  . Marital status: Single    Spouse name: Not on file  . Number of children: Not on file  . Years of education: Not on file  . Highest education level: Not on file  Occupational History  . Not on file  Tobacco Use  . Smoking status: Former    Current packs/day: 0.25    Types: Cigarettes  . Smokeless tobacco: Never  . Tobacco comments:    pt states 2-3 cigarettes per day  Vaping Use  . Vaping status: Never Used  Substance and Sexual Activity  . Alcohol use: Not Currently  . Drug use: Yes    Types: Marijuana  . Sexual activity: Not on file  Other Topics Concern  . Not on file  Social History Narrative  . Not on file   Social Drivers of Health   Financial Resource Strain: Not on file  Food Insecurity: Not on file  Transportation Needs: Not on file  Physical Activity: Not on file  Stress: Not on file  Social Connections: Not on file   Lives in a ***. Smoking: *** Occupation: ***  Environmental HistorySurveyor, Minerals in the house: Network Engineer in the family room: {Blank single:19197::yes,no} Carpet in the bedroom: {Blank single:19197::yes,no} Heating: {Blank single:19197::electric,gas,heat pump} Cooling: {Blank single:19197::central,window,heat pump} Pet: {Blank single:19197::yes ***,no}  Family History: Family History  Problem Relation Age of Onset  . Hypertension Mother   . Dementia Maternal Grandmother    Problem  Relation Asthma                                    *** Eczema                                *** Food allergy                           *** Allergic rhino conjunctivitis     ***  Review of Systems  Constitutional:  Negative for appetite change, chills, fever and unexpected weight change.  HENT:  Negative for congestion and rhinorrhea.   Eyes:  Negative for itching.  Respiratory:  Negative for cough, chest tightness, shortness of breath and wheezing.   Cardiovascular:  Negative for chest pain.  Gastrointestinal:  Negative for abdominal pain.  Genitourinary:  Negative for difficulty urinating.  Skin:  Negative for rash.  Neurological:  Negative for headaches.   Objective: There were no vitals taken for this visit. There is no height or weight on file to calculate BMI. Physical Exam Vitals and nursing note reviewed.  Constitutional:      Appearance: Normal appearance. He is well-developed.  HENT:     Head: Normocephalic and atraumatic.     Right Ear: Tympanic membrane and external ear normal.     Left Ear: Tympanic membrane and external ear normal.     Nose: Nose normal.     Mouth/Throat:     Mouth: Mucous membranes are moist.     Pharynx: Oropharynx is clear.  Eyes:     Conjunctiva/sclera: Conjunctivae normal.  Cardiovascular:     Rate and Rhythm: Normal rate and regular rhythm.     Heart sounds: Normal heart sounds. No murmur heard.    No friction rub. No gallop.  Pulmonary:     Effort: Pulmonary effort is normal.     Breath sounds: Normal breath sounds. No wheezing, rhonchi or rales.  Musculoskeletal:     Cervical back: Neck supple.  Skin:    General: Skin is warm.     Findings: No rash.  Neurological:     Mental Status: He is alert and oriented to person, place, and time.  Psychiatric:        Behavior: Behavior normal.  The plan was reviewed with the patient/family, and all questions/concerned were addressed.  It was my pleasure to see Ethan Wood today and participate in his care. Please feel free to contact me with any  questions or concerns.  Sincerely,  Orlan Cramp, DO Allergy  & Immunology  [facility]  [place] office: [phone] [facility] office: [phone]

## 2023-11-05 ENCOUNTER — Ambulatory Visit: Payer: Self-pay | Admitting: Allergy

## 2024-01-29 ENCOUNTER — Encounter (HOSPITAL_COMMUNITY): Payer: Self-pay

## 2024-01-29 ENCOUNTER — Ambulatory Visit (HOSPITAL_COMMUNITY)
Admission: EM | Admit: 2024-01-29 | Discharge: 2024-01-29 | Disposition: A | Discharge status: Home / Self Care | Attending: Physician Assistant | Admitting: Physician Assistant

## 2024-01-29 DIAGNOSIS — J4521 Mild intermittent asthma with (acute) exacerbation: Secondary | ICD-10-CM

## 2024-01-29 MED ORDER — FLUTICASONE PROPIONATE HFA 110 MCG/ACT IN AERO: 2.0000 | INHALATION_SPRAY | Freq: Two times a day (BID) | RESPIRATORY_TRACT | 0 refills | Status: DC

## 2024-01-29 MED ORDER — IPRATROPIUM-ALBUTEROL 0.5-2.5 (3) MG/3ML IN SOLN
RESPIRATORY_TRACT | Status: AC
  Filled 2024-01-29: qty 3

## 2024-01-29 MED ORDER — IPRATROPIUM-ALBUTEROL 0.5-2.5 (3) MG/3ML IN SOLN
3.0000 mL | Freq: Once | RESPIRATORY_TRACT | Status: AC
  Administered 2024-01-29: 3 mL via RESPIRATORY_TRACT

## 2024-01-29 MED ORDER — PREDNISONE 20 MG PO TABS: 40.0000 mg | ORAL_TABLET | Freq: Every day | ORAL | 0 refills | Status: AC

## 2024-01-29 NOTE — ED Triage Notes (Signed)
 Patient reports that he ran out of his Albuterol inhaler and is now having SOB.

## 2024-01-29 NOTE — Discharge Instructions (Addendum)
 You were seen today for concerns for your asthma.  You report difficulty breathing and shortness of breath.  At this time I suspect that your asthma has been exacerbated due to allergies and the change in the seasons.  We have sent in a refill of your Flovent and administered a DuoNeb treatment to help with your wheezing.  Please call your pharmacy with regards to your albuterol inhaler as you appear to have several refills left from when you were seen in November.  Please make sure that you are following up with your primary care provider for ongoing management and medication refills.  If at any point you start to develop fevers, chills, significant or more severe difficulty breathing, productive coughing, confusion, fatigue, loss of consciousness please go to the emergency room as these could be signs of a medical emergency.

## 2024-01-29 NOTE — ED Provider Notes (Signed)
 [facility]    CSN: [id number] Arrival date & time: 01/29/24  0803      History   Chief Complaint Chief Complaint  Patient presents with   Asthma   Shortness of Breath    HPI Ethan Wood is a 33 y.o. male.   HPI  He states he is having difficulty breathing   He states he has been having productive coughing and SOB with the change of the seasons He reports he ran out of his albuterol inhaler and has not had a chance to call the pharmacy regarding potential refills He denies fever, chills He reports he usually uses flovent daily but has been out of this as well as his albuterol   Past Medical History:  Diagnosis Date   Asthma     Patient Active Problem List   Diagnosis Date Noted   Multiple food allergies 09/10/2023   Eczema 09/10/2023   Smoking 09/10/2023   Asthma 04/07/2014   Allergic rhinitis 04/07/2014    Past Surgical History:  Procedure Laterality Date   FINGER SURGERY         Home Medications    Prior to Admission medications   Medication Sig Start Date End Date Taking? Authorizing Provider  fluticasone (FLOVENT HFA) 110 MCG/ACT inhaler Inhale 2 puffs into the lungs in the morning and at bedtime. 01/29/24  Yes Alekai Pocock E, PA-C  predniSONE (DELTASONE) 20 MG tablet Take 2 tablets (40 mg total) by mouth daily for 5 days. 01/29/24 02/03/24 Yes Bethann Qualley E, PA-C  albuterol (PROVENTIL) (2.5 MG/3ML) 0.083% nebulizer solution Take 3 mLs (2.5 mg total) by nebulization every 6 (six) hours as needed for wheezing or shortness of breath. 09/10/23   Tower, Audrie Gallus, MD  albuterol (VENTOLIN HFA) 108 (90 Base) MCG/ACT inhaler 2 to 4 puffs every 4-6 hours as needed for shortness of breath/wheezing 09/10/23   Tower, Idamae Schuller A, MD  EPINEPHrine 0.3 mg/0.3 mL IJ SOAJ injection Inject 0.3 mg into the muscle as needed for anaphylaxis. 09/10/23   Tower, Audrie Gallus, MD  Fluticasone Furoate (ARNUITY ELLIPTA) 100 MCG/ACT AEPB Inhale 100 mcg into the lungs daily. 09/11/23    Tower, Audrie Gallus, MD    Family History Family History  Problem Relation Age of Onset   Hypertension Mother    Dementia Maternal Grandmother     Social History Social History   Tobacco Use   Smoking status: Former    Current packs/day: 0.25    Types: Cigarettes   Smokeless tobacco: Never   Tobacco comments:    pt states 2-3 cigarettes per day  Vaping Use   Vaping status: Never Used  Substance Use Topics   Alcohol use: Not Currently   Drug use: Not Currently    Types: Marijuana     Allergies   Other   Review of Systems Review of Systems  Constitutional:  Negative for chills and fever.  HENT:  Negative for congestion, ear pain, rhinorrhea and sore throat.   Respiratory:  Positive for cough, chest tightness and shortness of breath.   Cardiovascular:  Negative for chest pain and palpitations.     Physical Exam Triage Vital Signs ED Triage Vitals [01/29/24 0815]  Encounter Vitals Group     BP (!) 148/84     Systolic BP Percentile      Diastolic BP Percentile      Pulse Rate (!) 59     Resp 18     Temp 98.3 F (36.8 C)  Temp Source Oral     SpO2 96 %     Weight      Height      Head Circumference      Peak Flow      Pain Score 0     Pain Loc      Pain Education      Exclude from Growth Chart    No data found.  Updated Vital Signs BP (!) 148/84 (BP Location: Left Arm)   Pulse (!) 59   Temp 98.3 F (36.8 C) (Oral)   Resp 18   SpO2 96%   Visual Acuity Right Eye Distance:   Left Eye Distance:   Bilateral Distance:    Right Eye Near:   Left Eye Near:    Bilateral Near:     Physical Exam Vitals reviewed.  Constitutional:      General: He is awake.     Appearance: Normal appearance. He is well-developed and well-groomed.  HENT:     Head: Normocephalic and atraumatic.     Right Ear: Hearing, tympanic membrane and ear canal normal.     Left Ear: Hearing, tympanic membrane and ear canal normal.     Mouth/Throat:     Lips: Pink.     Mouth:  Mucous membranes are moist.     Pharynx: Oropharynx is clear. Uvula midline. No pharyngeal swelling, posterior oropharyngeal erythema or postnasal drip.  Cardiovascular:     Rate and Rhythm: Normal rate and regular rhythm.     Heart sounds: Normal heart sounds.  Pulmonary:     Effort: Pulmonary effort is normal.     Breath sounds: No decreased air movement. Wheezing present. No decreased breath sounds, rhonchi or rales.  Neurological:     Mental Status: He is alert.  Psychiatric:        Attention and Perception: Attention and perception normal.        Mood and Affect: Mood and affect normal.        Speech: Speech normal.        Behavior: Behavior normal. Behavior is cooperative.      UC Treatments / Results  Labs (all labs ordered are listed, but only abnormal results are displayed) Labs Reviewed - No data to display  EKG   Radiology No results found.  Procedures Procedures (including critical care time)  Medications Ordered in UC Medications  ipratropium-albuterol (DUONEB) 0.5-2.5 (3) MG/3ML nebulizer solution 3 mL (3 mLs Nebulization Given 01/29/24 0854)    Initial Impression / Assessment and Plan / UC Course  I have reviewed the triage vital signs and the nursing notes.  Pertinent labs & imaging results that were available during my care of the patient were reviewed by me and considered in my medical decision making (see chart for details).      Final Clinical Impressions(s) / UC Diagnoses   Final diagnoses:  Mild intermittent asthma with exacerbation   Patient was seen today with concerns for asthma exacerbation.  He reports that he is run out of his albuterol inhaler as well as his fluticasone inhaler.  Physical exam is notable for diffuse wheezing and mildly decreased air movement.  Vitals are overall reassuring with regards to temp, oxygen saturation, respiratory rate.  DuoNeb treatment was administered in clinic today with modest improvement in air movement  and some decreased to wheezing in upper lobes.  Will send patient home with prednisone, Flovent refill.  Reviewed that he likely has refills of his albuterol he should just  call his pharmacy to initiate fills of those.  Reviewed importance of close follow-up with primary care provider for ongoing medication management.  ED return precautions reviewed and provided in after visit summary.  Follow-up as needed for progressing or persistent symptoms     Discharge Instructions      You were seen today for concerns for your asthma.  You report difficulty breathing and shortness of breath.  At this time I suspect that your asthma has been exacerbated due to allergies and the change in the seasons.  We have sent in a refill of your Flovent and administered a DuoNeb treatment to help with your wheezing.  Please call your pharmacy with regards to your albuterol inhaler as you appear to have several refills left from when you were seen in November.  Please make sure that you are following up with your primary care provider for ongoing management and medication refills.  If at any point you start to develop fevers, chills, significant or more severe difficulty breathing, productive coughing, confusion, fatigue, loss of consciousness please go to the emergency room as these could be signs of a medical emergency.     ED Prescriptions     Medication Sig Dispense Auth. Provider   fluticasone (FLOVENT HFA) 110 MCG/ACT inhaler Inhale 2 puffs into the lungs in the morning and at bedtime. 12 g Antonique Langford E, PA-C   predniSONE (DELTASONE) 20 MG tablet Take 2 tablets (40 mg total) by mouth daily for 5 days. 10 tablet Mumin Denomme E, PA-C      PDMP not reviewed this encounter.   Providence Crosby, PA-C 01/29/24 2536

## 2024-02-13 ENCOUNTER — Other Ambulatory Visit: Payer: Self-pay | Admitting: Family Medicine

## 2024-02-13 DIAGNOSIS — J4530 Mild persistent asthma, uncomplicated: Secondary | ICD-10-CM

## 2024-02-13 MED ORDER — ALBUTEROL SULFATE HFA 108 (90 BASE) MCG/ACT IN AERS: INHALATION_SPRAY | RESPIRATORY_TRACT | 3 refills | Status: DC

## 2024-02-13 NOTE — Telephone Encounter (Signed)
 Copied from CRM [id number]. Topic: Clinical - Medication Refill >> Feb 13, 2024  3:36 PM Orien Bird wrote: Most Recent Primary Care Visit:  Provider: Deri Fleet A  Department: [facility]  Visit Type: NEW PATIENT  Date: 09/10/2023  Medication:  albuterol (VENTOLIN HFA) 108 (90 Base) MCG/ACT inhaler   Has the patient contacted their pharmacy? Yes (Agent: If no, request that the patient contact the pharmacy for the refill. If patient does not wish to contact the pharmacy document the reason why and proceed with request.) (Agent: If yes, when and what did the pharmacy advise?)  Is this the correct pharmacy for this prescription? Yes If no, delete pharmacy and type the correct one.  This is the patient's preferred pharmacy:  [facility] [phone] - [place], [place] - [address] AT [facility] [address] [place] [address] Phone: [phone] Fax: [phone]   Has the prescription been filled recently? No  Is the patient out of the medication? Yes  Has the patient been seen for an appointment in the last year OR does the patient have an upcoming appointment? Yes  Can we respond through MyChart? Yes  Agent: Please be advised that Rx refills may take up to 3 business days. We ask that you follow-up with your pharmacy.

## 2024-03-05 ENCOUNTER — Other Ambulatory Visit: Payer: Self-pay | Admitting: Family Medicine

## 2024-03-05 DIAGNOSIS — J4530 Mild persistent asthma, uncomplicated: Secondary | ICD-10-CM

## 2024-03-05 MED ORDER — ALBUTEROL SULFATE HFA 108 (90 BASE) MCG/ACT IN AERS: INHALATION_SPRAY | RESPIRATORY_TRACT | 3 refills | Status: DC

## 2024-03-05 NOTE — Telephone Encounter (Signed)
 Copied from CRM [id number]. Topic: Clinical - Medication Refill >> Mar 05, 2024  9:22 AM Rosamond Comes wrote: Patient need this inhaler for work due to dust.  Medication: albuterol  (VENTOLIN  HFA) 108 (90 Base) MCG/ACT inhaler  Has the patient contacted their pharmacy? No Pharmacy always has patient call the provider first  This is the patient's preferred pharmacy:   [facility] [phone] - [place], [place] - [address] AT [facility] OF [address] [place] [address] Phone: [phone] Fax: [phone]  Is this the correct pharmacy for this prescription? Yes If no, delete pharmacy and type the correct one.   Has the prescription been filled recently? Yes  Is the patient out of the medication? Yes  Has the patient been seen for an appointment in the last year OR does the patient have an upcoming appointment? No  Can we respond through MyChart? No    Patient phone  [phone] ok to leave a detailed message.  Agent: Please be advised that Rx refills may take up to 3 business days. We ask that you follow-up with your pharmacy. >> Mar 05, 2024  9:34 AM Rosamond Comes wrote: Patient needs this refilled today, because of work environment

## 2024-03-05 NOTE — Telephone Encounter (Signed)
 Phone [facility], verified that patient has 3 refills left on prescription, phoned patient and notified him of refills.     Copied from CRM [id number]. Topic: Clinical - Medication Refill >> Mar 05, 2024  9:22 AM Rosamond Comes wrote: Patient need this inhaler for work due to dust.  Medication: albuterol  (VENTOLIN  HFA) 108 (90 Base) MCG/ACT inhaler  Has the patient contacted their pharmacy? No Pharmacy always has patient call the provider first  This is the patient's preferred pharmacy:   [facility] [phone] - [place], [place] - [address] AT [facility] OF [address] [place] [address] Phone: [phone] Fax: [phone]  Is this the correct pharmacy for this prescription? Yes If no, delete pharmacy and type the correct one.   Has the prescription been filled recently? Yes  Is the patient out of the medication? Yes  Has the patient been seen for an appointment in the last year OR does the patient have an upcoming appointment? No  Can we respond through MyChart? No    Patient phone  [phone] ok to leave a detailed message.  Agent: Please be advised that Rx refills may take up to 3 business days. We ask that you follow-up with your pharmacy. >> Mar 05, 2024  9:34 AM Rosamond Comes wrote: Patient needs this refilled today, because of work environment

## 2024-03-05 NOTE — Telephone Encounter (Signed)
 Copied from CRM [id number]. Topic: Clinical - Medication Refill >> Mar 05, 2024  9:22 AM Rosamond Comes wrote: Patient need this inhaler for work due to dust.  Medication: albuterol  (VENTOLIN  HFA) 108 (90 Base) MCG/ACT inhaler  Has the patient contacted their pharmacy? No Pharmacy always has patient call the provider first  This is the patient's preferred pharmacy:   [facility] [phone] - [place], [place] - [address] AT [facility] OF [address] [place] [address] Phone: [phone] Fax: [phone]  Is this the correct pharmacy for this prescription? Yes If no, delete pharmacy and type the correct one.   Has the prescription been filled recently? Yes  Is the patient out of the medication? Yes  Has the patient been seen for an appointment in the last year OR does the patient have an upcoming appointment? No  Can we respond through MyChart? No    Patient phone  [phone] ok to leave a detailed message.  Agent: Please be advised that Rx refills may take up to 3 business days. We ask that you follow-up with your pharmacy.

## 2024-03-20 ENCOUNTER — Encounter: Payer: Self-pay | Admitting: Family Medicine

## 2024-03-20 ENCOUNTER — Ambulatory Visit: Admitting: Family Medicine

## 2024-03-20 VITALS — BP 122/78 | HR 73 | Temp 97.7°F | Ht 71.0 in | Wt 169.4 lb

## 2024-03-20 DIAGNOSIS — J3089 Other allergic rhinitis: Secondary | ICD-10-CM

## 2024-03-20 DIAGNOSIS — J4530 Mild persistent asthma, uncomplicated: Secondary | ICD-10-CM | POA: Diagnosis not present

## 2024-03-20 DIAGNOSIS — Z91018 Allergy to other foods: Secondary | ICD-10-CM | POA: Diagnosis not present

## 2024-03-20 DIAGNOSIS — L2082 Flexural eczema: Secondary | ICD-10-CM | POA: Diagnosis not present

## 2024-03-20 MED ORDER — ALBUTEROL SULFATE HFA 108 (90 BASE) MCG/ACT IN AERS: INHALATION_SPRAY | RESPIRATORY_TRACT | 3 refills | Status: AC

## 2024-03-20 MED ORDER — BUDESONIDE-FORMOTEROL FUMARATE 160-4.5 MCG/ACT IN AERO: 2.0000 | INHALATION_SPRAY | Freq: Two times a day (BID) | RESPIRATORY_TRACT | 3 refills | Status: AC

## 2024-03-20 NOTE — Patient Instructions (Addendum)
 Call your insurance company or your pharmacist to get a list of maintenance medicines for asthma (steroid inhalers)  Let us  know asap and I will send it in  Once we know what is covered I can send it in   I can send in symbicort - will have to see if it is covered  I sent in more refills on ventolin      I put the referral in for allergist  Please let us  know if you don't hear in 1-2 weeks to set that up  Avoid triggers when you can   Make sure to use a scent free moisturizer like eucerin or lubriderm on eczema areas , then hydrocortisone when needed

## 2024-03-20 NOTE — Assessment & Plan Note (Addendum)
 Worsened recently in season  Also dust at work/smoke at home Inh fluticasone  not covered (even though pharmacy indicated that arunity ellipta was pt says denied) Pt never told us  this unfortunately and ran out of medicine/has been suffering Recent uc visit reviewed Prednisone  helped temporarily   Reassuring exam today-pt had used albuterol  this am  Using ventolin  mdi for rescue  Missed allergist ref due to work-referral re done and had serious conversation about calling to cancel if he cannot make it   Sent symbicort to see if covered/needs mt therapy  Discussed trigger avoidance FMLA updated for asthma   Call back and Er precautions noted in detail today

## 2024-03-20 NOTE — Assessment & Plan Note (Signed)
Carries epi pen.  

## 2024-03-20 NOTE — Assessment & Plan Note (Signed)
 Worse lately  Using topical hydrocortisone on arms and legs Encouraged to try moisturizers (non scented) first  Avoid heat and fragrances Ref to allergist also

## 2024-03-20 NOTE — Assessment & Plan Note (Signed)
 Improved recently.

## 2024-03-20 NOTE — Progress Notes (Signed)
 Subjective:    Patient ID: Ethan Wood, male    DOB: 05-14-1991, 33 y.o.   MRN: [id number]  HPI  Wt Readings from Last 3 Encounters:  03/20/24 169 lb 6 oz (76.8 kg)  09/10/23 165 lb 2 oz (74.9 kg)  06/04/19 159 lb 9.6 oz (72.4 kg)   23.62 kg/m  Vitals:   03/20/24 1024  BP: 122/78  Pulse: 73  Temp: 97.7 F (36.5 C)  SpO2: 99%    Pt presents for follow up of asthma and allergies   Asthma Moderate intermittent   Flovent  110 mcg  2 puffs bid/ or arnuity ellipta  --turned out that neither were covered  Albuterol  mdi  Albuterol  nmt   [facility] Ran out of his meds at that time Given duo neb treatment  Prednisone  and refill flovent    Worse in past few weeks  Pollen makes is worse  Showers a lot  Usually gets a little better mid summer- worse in late summer again    Missed his appointment to allergy doctor   Former smoker   Works in a Social research officer, government  Allergic to pollen  [employer] forms   Lives with mother  She smokes in house  He is saving money to move out      Patient Active Problem List   Diagnosis Date Noted   Multiple food allergies 09/10/2023   Eczema 09/10/2023   Former smoker 09/10/2023   Asthma 04/07/2014   Allergic rhinitis 04/07/2014   Past Medical History:  Diagnosis Date   Asthma    Past Surgical History:  Procedure Laterality Date   FINGER SURGERY     Social History   Tobacco Use   Smoking status: Former    Current packs/day: 0.25    Types: Cigarettes   Smokeless tobacco: Never   Tobacco comments:    pt states 2-3 cigarettes per day  Vaping Use   Vaping status: Never Used  Substance Use Topics   Alcohol use: Not Currently   Drug use: Not Currently    Types: Marijuana   Family History  Problem Relation Age of Onset   Hypertension Mother    Dementia Maternal Grandmother    Allergies  Allergen Reactions   Other Anaphylaxis    FRESh fruit and veggies   Current Outpatient Medications on File Prior to Visit   Medication Sig Dispense Refill   albuterol  (PROVENTIL ) (2.5 MG/3ML) 0.083% nebulizer solution Take 3 mLs (2.5 mg total) by nebulization every 6 (six) hours as needed for wheezing or shortness of breath. 75 mL 1   EPINEPHrine  0.3 mg/0.3 mL IJ SOAJ injection Inject 0.3 mg into the muscle as needed for anaphylaxis. 1 each 1   No current facility-administered medications on file prior to visit.    Review of Systems  Constitutional:  Negative for activity change, appetite change, fatigue, fever and unexpected weight change.  HENT:  Positive for postnasal drip and rhinorrhea. Negative for congestion, sore throat and trouble swallowing.   Eyes:  Negative for pain, redness, itching and visual disturbance.  Respiratory:  Positive for shortness of breath and wheezing. Negative for cough and chest tightness.   Cardiovascular:  Negative for chest pain and palpitations.  Gastrointestinal:  Negative for abdominal pain, blood in stool, constipation, diarrhea and nausea.  Endocrine: Negative for cold intolerance, heat intolerance, polydipsia and polyuria.  Genitourinary:  Negative for difficulty urinating, dysuria, frequency and urgency.  Musculoskeletal:  Negative for arthralgias, joint swelling and myalgias.  Skin:  Positive for rash. Negative for pallor.  Neurological:  Negative for dizziness, tremors, weakness, numbness and headaches.  Hematological:  Negative for adenopathy. Does not bruise/bleed easily.  Psychiatric/Behavioral:  Negative for decreased concentration and dysphoric mood. The patient is not nervous/anxious.        Objective:   Physical Exam Constitutional:      General: He is not in acute distress.    Appearance: Normal appearance. He is well-developed and normal weight. He is not ill-appearing or diaphoretic.  HENT:     Head: Normocephalic and atraumatic.  Eyes:     Conjunctiva/sclera: Conjunctivae normal.     Pupils: Pupils are equal, round, and reactive to light.  Neck:      Thyroid: No thyromegaly.     Vascular: No carotid bruit or JVD.  Cardiovascular:     Rate and Rhythm: Normal rate and regular rhythm.     Heart sounds: Normal heart sounds.     No gallop.  Pulmonary:     Effort: Pulmonary effort is normal. No respiratory distress.     Breath sounds: No stridor. Wheezing present. No rhonchi or rales.     Comments: Wheeze only with forced exp  Abdominal:     General: There is no distension or abdominal bruit.     Palpations: Abdomen is soft.  Musculoskeletal:     Cervical back: Normal range of motion and neck supple.     Right lower leg: No edema.     Left lower leg: No edema.  Lymphadenopathy:     Cervical: No cervical adenopathy.  Skin:    General: Skin is warm and dry.     Coloration: Skin is not pale.     Findings: No rash.     Comments: Dry patchy skin on arms and posterior legs consistent with eczema   Neurological:     Mental Status: He is alert.     Coordination: Coordination normal.     Deep Tendon Reflexes: Reflexes are normal and symmetric. Reflexes normal.  Psychiatric:        Mood and Affect: Mood normal.           Assessment & Plan:   Problem List Items Addressed This Visit       Respiratory   Asthma - Primary   Worsened recently in season  Also dust at work/smoke at home Inh fluticasone  not covered (even though pharmacy indicated that arunity ellipta was pt says denied) Pt never told us  this unfortunately and ran out of medicine/has been suffering Recent uc visit reviewed Prednisone  helped temporarily   Reassuring exam today-pt had used albuterol  this am  Using ventolin  mdi for rescue  Missed allergist ref due to work-referral re done and had serious conversation about calling to cancel if he cannot make it   Sent symbicort to see if covered/needs mt therapy  Discussed trigger avoidance FMLA updated for asthma   Call back and Er precautions noted in detail today        Relevant Medications    budesonide-formoterol (SYMBICORT) 160-4.5 MCG/ACT inhaler   albuterol  (VENTOLIN  HFA) 108 (90 Base) MCG/ACT inhaler   Other Relevant Orders   Ambulatory referral to Allergy   Allergic rhinitis   Improved recently       Relevant Orders   Ambulatory referral to Allergy     Musculoskeletal and Integument   Eczema   Worse lately  Using topical hydrocortisone on arms and legs Encouraged to try moisturizers (non scented) first  Avoid heat and  fragrances Ref to allergist also         Other   Multiple food allergies   Carries epi pen      Relevant Orders   Ambulatory referral to Allergy   Other Visit Diagnoses       Hx of food allergy       Relevant Orders   Ambulatory referral to Allergy

## 2024-04-07 ENCOUNTER — Telehealth: Payer: Self-pay

## 2024-04-07 NOTE — Telephone Encounter (Signed)
 Copied from CRM [id number]. Topic: General - Other >> Apr 07, 2024 12:31 PM Peg Bouton F wrote: Reason for CRM: Pt states that his FMLA paperwork was missing some information required by his employer which included being tardy and some other things. States he is going to bring the paperwork back by to have these things added please.  Thank you

## 2024-04-09 ENCOUNTER — Telehealth: Payer: Self-pay

## 2024-04-09 NOTE — Telephone Encounter (Signed)
 Patient verbalized to include the days he went in late versus taking the whole day off.

## 2024-04-09 NOTE — Telephone Encounter (Signed)
 Patient dropped off FMLA forms that needs to be filled out and the forms are in the providers box.  FMLA/Disability fee not paid yet.

## 2024-04-10 ENCOUNTER — Telehealth: Payer: Self-pay

## 2024-04-10 DIAGNOSIS — Z0279 Encounter for issue of other medical certificate: Secondary | ICD-10-CM

## 2024-04-10 NOTE — Telephone Encounter (Signed)
 FMLA extension forms received.   Patient states he needs number of days changed from 3 to 5, and hours changed from 8 to 2, so he will not be penalized for coming in late due to nebulizer treatments.  I will reach out to patient when forms are completed to pick up and give to employer.  Placed in providers box for review.

## 2024-04-10 NOTE — Telephone Encounter (Signed)
 I cannot finish paperwork until I know allergist appointment has been scheduled  Looking at the referral - ? They could not reach him to schedule?   He is missing work because of asthma-this is why I referred him  Will hold paperwork on desk until someone tells me when his allergist appointment is-this will need to be documented Thanks for the help

## 2024-04-10 NOTE — Telephone Encounter (Addendum)
 See 04/10/24 phn note.

## 2024-04-10 NOTE — Telephone Encounter (Signed)
 I don't see it- is it up front?  Is it printable from my chart?   Thanks

## 2024-04-11 ENCOUNTER — Telehealth: Payer: Self-pay | Admitting: Family Medicine

## 2024-04-11 NOTE — Telephone Encounter (Signed)
 Copied from CRM [id number]. Topic: Clinical - Medical Advice >> Apr 11, 2024 11:58 AM Baldo Levan wrote: Reason for CRM: Patient is calling to let the doctor know he got the budesonide -formoterol  (SYMBICORT ) 160-4.5 MCG/ACT inhaler, and is still having the same issues. Patient would like a call back when the doctor or nurse has a moment.

## 2024-04-11 NOTE — Telephone Encounter (Signed)
 Thanks for letting me know  Just want to make sure he gets in with an allergist-thanks

## 2024-04-11 NOTE — Telephone Encounter (Signed)
 Pt said he has tried to call the allergist office twice. He did get their miss call and called right back and no one answered so he left a voicemail. Right before he dropped off the forms he called their office again and they still didn't pick up so he left a 2nd VM. He is asking if our referral dpt can help get him an appt.   Will route to referral dpt to see if they can help get pt an appt scheduled and also will route back to Dr. Malissa Se

## 2024-04-14 NOTE — Telephone Encounter (Signed)
 Please follow up on this when able  I will not do paper work until I know he has an allergist appointment

## 2024-04-15 NOTE — Telephone Encounter (Signed)
 According to the referral notes, multiple attempts have been made to reach the patient to schedule with several VMs left and they have been unable to reach.

## 2024-04-15 NOTE — Telephone Encounter (Signed)
 Called pt and directly transferred him to their dpt to schedule appt

## 2024-04-16 NOTE — Telephone Encounter (Signed)
 Appt has been scheduled for 05/22/24

## 2024-04-21 NOTE — Telephone Encounter (Signed)
 Completed forms received. Copy sent to scan. Original copy for employer and copy for patients records at the front desk. Pt notified forms were ready to pick up.

## 2024-04-21 NOTE — Telephone Encounter (Signed)
 Done and in IN box

## 2024-05-08 NOTE — Telephone Encounter (Signed)
Will route to PCP for review. 

## 2024-05-08 NOTE — Telephone Encounter (Signed)
 Pt called stating Part B: amount of leave needed, on the 3 page of his FMLA ppw was filled out incorrectly. Pt stated the 2hrs needs to be changed to 8-10 hrs instead. Pt states his employer is also requesting the ppw include a note regarding the use of his nebulizer, explaining how the use of it may take 2-3 hours, causing him to be tardy to work. Pt states the note also needs to mention, if he is tardy, due to the use of his nebulizer & if he may need to leave work early, it doesn't need to be counted against him. Let pt know Dr. Randeen is out of the office until 05/19/24, incase there is a delay in the ppw being comp again. Pt states his employer is looking to have the ppw returned back by next week. Please advise. Call back # [phone]

## 2024-05-08 NOTE — Telephone Encounter (Signed)
 Received message regarding updates needed for patients employer. Copy of forms have been placed in the providers box to review.

## 2024-05-12 NOTE — Telephone Encounter (Signed)
 Put back in IN box  Thanks

## 2024-05-12 NOTE — Telephone Encounter (Signed)
 Pt states employer is stating a retroactive date of 5.28.25 be added to the provider note on FMLA forms.  Forms placed in providers box for review.

## 2024-05-12 NOTE — Telephone Encounter (Signed)
 A nebulizer treatment generally does not last more than 20 minutes or so - I cannot write that note to the employer regarding 2-3 hours   Can he clarify?   Thanks  He is seeing the allergist soon and hope asthma will be in good enough control-hoping for enough improvement he will not miss as much work

## 2024-05-12 NOTE — Telephone Encounter (Signed)
 Reached out to the patient for clarification regarding time needed on FMLA forms. Pt states he needs the form to say 8-10hrs per episode and written somewhere on the form  that patient will not be penalized for coming in late or for leaving early.  Placed in providers box for review.

## 2024-05-13 NOTE — Telephone Encounter (Signed)
 Spoke with provider about adding a back date for patients employer. Provider advised that cannot be done.  Copy sent to scan.  Called patient to let him know forms are ready to pick up to give employer, unable to leave voicemail as it is not set up.  Forms are at the front desk if patient calls back asking for them.

## 2024-05-22 ENCOUNTER — Ambulatory Visit (INDEPENDENT_AMBULATORY_CARE_PROVIDER_SITE_OTHER): Payer: Self-pay | Admitting: Allergy

## 2024-05-22 ENCOUNTER — Other Ambulatory Visit: Payer: Self-pay

## 2024-05-22 ENCOUNTER — Encounter: Payer: Self-pay | Admitting: Allergy

## 2024-05-22 VITALS — BP 120/80 | HR 50 | Temp 98.1°F | Resp 16 | Ht 70.87 in | Wt 165.8 lb

## 2024-05-22 DIAGNOSIS — L239 Allergic contact dermatitis, unspecified cause: Secondary | ICD-10-CM

## 2024-05-22 DIAGNOSIS — L209 Atopic dermatitis, unspecified: Secondary | ICD-10-CM

## 2024-05-22 DIAGNOSIS — T781XXD Other adverse food reactions, not elsewhere classified, subsequent encounter: Secondary | ICD-10-CM | POA: Diagnosis not present

## 2024-05-22 DIAGNOSIS — J4520 Mild intermittent asthma, uncomplicated: Secondary | ICD-10-CM | POA: Diagnosis not present

## 2024-05-22 DIAGNOSIS — T781XXA Other adverse food reactions, not elsewhere classified, initial encounter: Secondary | ICD-10-CM

## 2024-05-22 DIAGNOSIS — J302 Other seasonal allergic rhinitis: Secondary | ICD-10-CM

## 2024-05-22 NOTE — Patient Instructions (Addendum)
 Inflammatory dermatitis / Hx of atopic dermatitis -Allegra 1-2 times daily -Use long sleeves, moisturization -Mometasone cream  Asthma -Continue Symbicort  2 puffs 2 times daily -Add spacer  Perennial and Allergic rhinitis -Return for skin testing -Allegra 1-2 times daily -Consider SCIT ?  Oral Allergy Syndrome -Avoid raw fruits, vegetables, and tree nuts

## 2024-05-22 NOTE — Progress Notes (Unsigned)
 New Patient Note  RE: Ethan Wood MRN: [id number] DOB: Jul 04, 1991 Date of Office Visit: 05/22/2024  Referring provider: Randeen Laine LABOR, MD Primary care provider: Randeen Laine LABOR, MD  Chief Complaint: Arm, knees itching and dryness  History of present illness: Ethan Wood is a 33 y.o. male presenting today for an evaluation of worsening skin rash.  For the past 6-7 months, the patient has had itchy and thickened, darker skin to the elbows, forearms, and knees. He has a history of atopic dermatitis limited to the flexural elbow creases. He switched positions at work and since about 10-11 months ago, he has been filling chemical orders in a warehouse. He was wearing sleeves and gloves when it was cold, but no longer wears long sleeves as the warehouse is not cooled. When he missed 3 days, his skin symptoms cleared up and when returning to work his skin worsened. His symptoms seem to worsen as the day goes on and when leaving work. The itchiness and redness is alleviated with benadryl or zyrtec , but he notices drowsiness and avoids these medications. The patient tried OTC hydrocortisone cream and unscented lubriderm without alleviation.  He has never been tested for allergies, but knows that he has significant nasal congestion, nose/eye sneezing, itchiness throughout the year that is worst in the Spring. Ethan Wood also knows that he has localized throat and mouth itching, swelling following consumption of various raw fruits, vegetables, and tree nuts. If these foods are processed or cooked, he does not have this reaction.  Along with this, his known asthma worsens. In the Spring, he needs his symbicort  2 puffs twice daily as well as albuterol . The patient has a nebulizer, but does not use it. At this time he has no breathing problems.  Review of systems: Review of Systems  HENT:  Positive for congestion, rhinorrhea and sneezing.   Eyes:  Positive for redness and itching.  Skin:  Positive for color  change and rash.       Positive for skin itching    All other systems negative unless noted above in HPI  Past medical history: Past Medical History:  Diagnosis Date   Asthma    Eczema     Past surgical history: Past Surgical History:  Procedure Laterality Date   FINGER SURGERY      Family history:  Family History  Problem Relation Age of Onset   Hypertension Mother    Dementia Maternal Grandmother     Social history: Social History   Socioeconomic History   Marital status: Single    Spouse name: Not on file   Number of children: Not on file   Years of education: Not on file   Highest education level: Not on file  Occupational History   Not on file  Tobacco Use   Smoking status: Former    Current packs/day: 0.25    Types: Cigarettes   Smokeless tobacco: Never   Tobacco comments:    pt states 2-3 cigarettes per day  Vaping Use   Vaping status: Never Used  Substance and Sexual Activity   Alcohol use: Not Currently   Drug use: Not Currently    Types: Marijuana   Sexual activity: Not on file  Other Topics Concern   Not on file  Social History Narrative   Not on file   Social Drivers of Health   Financial Resource Strain: Not on file  Food Insecurity: Not on file  Transportation Needs: Not on file  Physical Activity: Not  on file  Stress: Not on file  Social Connections: Not on file  Intimate Partner Violence: Not on file    Medication List: Current Outpatient Medications  Medication Sig Dispense Refill   albuterol  (PROVENTIL ) (2.5 MG/3ML) 0.083% nebulizer solution Take 3 mLs (2.5 mg total) by nebulization every 6 (six) hours as needed for wheezing or shortness of breath. 75 mL 1   albuterol  (VENTOLIN  HFA) 108 (90 Base) MCG/ACT inhaler 2 to 4 puffs every 4-6 hours as needed for shortness of breath/wheezing 18 g 3   budesonide -formoterol  (SYMBICORT ) 160-4.5 MCG/ACT inhaler Inhale 2 puffs into the lungs 2 (two) times daily. 1 each 3   EPINEPHrine  0.3  mg/0.3 mL IJ SOAJ injection Inject 0.3 mg into the muscle as needed for anaphylaxis. 1 each 1   No current facility-administered medications for this visit.    Known medication allergies: Allergies  Allergen Reactions   Other Anaphylaxis    FRESh fruit and veggies     Physical examination: Blood pressure 120/80, pulse (!) 50, temperature 98.1 F (36.7 C), resp. rate 16, height 5' 10.87 (1.8 m), weight 165 lb 12.8 oz (75.2 kg), SpO2 98%.  General: Alert, interactive, in no acute distress. HEENT: PERRLA, TMs pearly gray, turbinates markedly edematous without discharge, post-pharynx non erythematous. Neck: Supple without lymphadenopathy. Lungs: Clear to auscultation without wheezing, rhonchi or rales. {no increased work of breathing. CV: Normal S1, S2 without murmurs. Abdomen: Nondistended, nontender. Skin: Dry, hyperpigmented, thickened patches on the forearms, elbows, and posterior knees. Extremities:  No clubbing, cyanosis or edema. Neuro:   Grossly intact.  Left forearm   Right posterior knee   Diagnositics/Labs: Labs: None  Spirometry: FEV1: ***, FVC: ***, ratio consistent with ***  Allergy testing: None, will return for testing.  Assessment and plan: There are no Patient Instructions on file for this visit.  No follow-ups on file.  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 Exam shows lichenified skin with hyperpigmentation. I suspect atopic dermatitis further exacerbated by significant daily exposure to dust as well as scraping arms against cardboard, although contact dermatitis is possible. He seems to have perennial and seasonal allergic rhinitis by history with turbinate hypertrophy at this time, as well as asthma. Will step up from OTC hydrocortisone to Mometasone and have him return for allergy testing. Advised allegra 1-2  times daily and to add spacer to assist his symbicort  usage. Due to his intolerance to various raw fruits, vegetables, and tree nuts (throat swelling, itchiness) suggestive or oral allergy syndrome, I believe that he has strong pollen allergies and may benefit from SCIT. Return for skin testing.  I appreciate the opportunity to take part in [place] care. Please do not hesitate to contact me with questions.  Sincerely,  Donnice Mutter, MS4 [employer]  Danita Brain, MD Allergy/Immunology [facility]

## 2024-05-23 MED ORDER — FEXOFENADINE HCL 180 MG PO TABS: 180.0000 mg | ORAL_TABLET | Freq: Every day | ORAL | 5 refills | Status: AC

## 2024-05-23 MED ORDER — MOMETASONE FUROATE 0.1 % EX OINT: TOPICAL_OINTMENT | Freq: Every day | CUTANEOUS | 2 refills | Status: AC

## 2024-05-26 NOTE — Addendum Note (Signed)
 Addended by: DANIEL NIVIA DEL on: 05/26/2024 05:08 PM   Modules accepted: Orders

## 2024-05-28 ENCOUNTER — Encounter: Payer: Self-pay | Admitting: Allergy

## 2024-05-28 ENCOUNTER — Ambulatory Visit (INDEPENDENT_AMBULATORY_CARE_PROVIDER_SITE_OTHER): Admitting: Allergy

## 2024-05-28 DIAGNOSIS — J302 Other seasonal allergic rhinitis: Secondary | ICD-10-CM | POA: Diagnosis not present

## 2024-05-28 DIAGNOSIS — T781XXD Other adverse food reactions, not elsewhere classified, subsequent encounter: Secondary | ICD-10-CM | POA: Diagnosis not present

## 2024-05-28 NOTE — Progress Notes (Signed)
 Follow-up Note  RE: Ethan Wood MRN: [id number] DOB: December 27, 1990 Date of Office Visit: 05/28/2024   History of present illness: Ethan Wood is a 33 y.o. male presenting today for skin testing visit.  He was last seen in the office on 05/22/24 for contact dermatitis, eczema, asthma, allergic rhinitis with oral allergy  syndrome.  He is in her usual state of health today without recent illness.  He has held antihistamines for at least 3 days for testing today.   Medication List: Current Outpatient Medications  Medication Sig Dispense Refill   albuterol  (PROVENTIL ) (2.5 MG/3ML) 0.083% nebulizer solution Take 3 mLs (2.5 mg total) by nebulization every 6 (six) hours as needed for wheezing or shortness of breath. 75 mL 1   albuterol  (VENTOLIN  HFA) 108 (90 Base) MCG/ACT inhaler 2 to 4 puffs every 4-6 hours as needed for shortness of breath/wheezing 18 g 3   budesonide -formoterol  (SYMBICORT ) 160-4.5 MCG/ACT inhaler Inhale 2 puffs into the lungs 2 (two) times daily. 1 each 3   EPINEPHrine  0.3 mg/0.3 mL IJ SOAJ injection Inject 0.3 mg into the muscle as needed for anaphylaxis. 1 each 1   fexofenadine  (ALLEGRA ) 180 MG tablet Take 1 tablet (180 mg total) by mouth daily. 30 tablet 5   mometasone  (ELOCON ) 0.1 % ointment Apply topically daily. 45 g 2   No current facility-administered medications for this visit.     Known medication allergies: Allergies  Allergen Reactions   Other Anaphylaxis    FRESh fruit and veggies     Diagnostics/Labs:  Allergy  testing:   Airborne Adult Perc - 05/28/24 0946     Time Antigen Placed 9053    Allergen Manufacturer Jestine    Location Back    Number of Test 55    Panel 1 Select    1. Control-Buffer 50% Glycerol Negative    2. Control-Histamine 2+    3. Bahia Negative    4. French [place] Territ[place] Negative    5. Johnson Negative    6. [place]  Blue Negative    7. Meadow Fescue 4+    8. Perennial Rye 4+    9. Timothy 4+    10. Ragweed Mix 2+    11. Cocklebur 3+    12.  Plantain,  English 4+    13. Baccharis 2+    14. Dog Fennel 2+    15. Russian Thistle 4+    16. Lamb's Quarters 4+    17. Sheep Sorrell 4+    18. Rough Pigweed 3+    19. Marsh Elder, Rough 3+    20. Mugwort, Common 4+    21. Box, Elder 4+    22. Cedar, red Negative    23. Sweet Gum 4+    24. Pecan Pollen 4+    25. Pine Mix Negative    26. Walnut, Black Pollen Negative    27. Red Mulberry 4+    28. Ash Mix 4+    29. Birch Mix 3+    30. Beech American 3+    31. Cottonwood, Eastern 4+    32. Hickory, White 4+    33. Maple Mix Negative    34. Oak, [place] Mix 2+    35. Sycamore Eastern Negative    36. Alternaria Alternata Negative    37. Cladosporium Herbarum Negative    38. Aspergillus Mix Negative    39. Penicillium Mix 2+    40. Bipolaris Sorokiniana (Helminthosporium) 2+    41. Drechslera Spicifera (Curvularia) Negative    42. Mucor Plumbeus  Negative    43. Fusarium Moniliforme Negative    44. Aureobasidium Pullulans (pullulara) Negative    45. Rhizopus Oryzae 2+    46. Botrytis Cinera 2+    47. Epicoccum Nigrum Negative    48. Phoma Betae Negative    49. Dust Mite Mix 4+    50. Cat Hair 10,000 BAU/ml 4+    51.  Dog Epithelia 2+    52. Mixed Feathers Negative    53. Horse Epithelia Negative    54. Cockroach, German Negative    55. Tobacco Leaf Negative          Food Adult Perc - 05/28/24 0900     Time Antigen Placed 9053    Allergen Manufacturer Jestine    Location Back    Number of allergen test 20    10. Cashew Negative    11. Walnut Food Negative    12. Almond Negative    13. Hazelnut Negative    14. Pecan Food Negative    15. Pistachio Negative    16. [place] Nut Negative    49. Cabbage Negative    50. Carrots 2+    54. Grape (White seedless) Negative    55. Orange  Negative    57. Banana Negative    58. Apple Negative    59. Peach Negative    60. Strawberry Negative    61. Blueberry Negative    62. Cherry Negative    63. Cantaloupe Negative     64. Watermelon 2+    65. Pineapple Negative          Allergy  testing results were read and interpreted by provider, documented by clinical staff.   Assessment and plan:   Inflammatory dermatitis / Hx of atopic dermatitis -Allegra  1-2 times daily -Use long sleeves with possible -Daily moisturization -Mometasone  cream twice a day as needed for eczema flares (itchy, dry, patchy, bumpy, irritated, scaly, flaky skin)  Asthma -Continue Symbicort  2 puffs 2 times daily.   Rinse mouth after use -Use spacer with pump inhalers -Have access to albuterol  inhaler 2 puffs every 4-6 hours as needed for cough/wheeze/shortness of breath/chest tightness.  May use 15-20 minutes prior to activity.   Monitor frequency of use.    Asthma control goals:  Full participation in all desired activities (may need albuterol  before activity) Albuterol  use two time or less a week on average (not counting use with activity) Cough interfering with sleep two time or less a month Oral steroids no more than once a year No hospitalizations   Perennial and Allergic rhinitis - Testing today showed: grasses, ragweed, weeds, trees, indoor molds, outdoor molds, dust mites, cat, and dog - Copy of test results provided.  - Avoidance measures provided. -Allegra  1-2 times daily - You can use an extra dose of the antihistamine, if needed, for breakthrough symptoms.  - Consider allergy  shots as a means of long-term control. - Allergy  shots re-train and reset the immune system to ignore environmental allergens and decrease the resulting immune response to those allergens (sneezing, itchy watery eyes, runny nose, nasal congestion, etc).    - Allergy  shots improve symptoms in 75-85% of patients.  - We can discuss more at a future appointment if the medications are not working for you.  Oral Allergy  Syndrome -Avoid raw fruits, vegetables, and nuts -Testing is positive to carrots and watermelon The oral allergy  syndrome  (OAS) or pollen-food allergy  syndrome (PFAS) is a relatively common form of food allergy , particularly in adults. It  typically occurs in people who have pollen allergies when the immune system sees proteins on the food that look like proteins on the pollen. This results in the allergy  antibody (IgE) binding to the food instead of the pollen. Patients typically report itching and/or mild swelling of the mouth and throat immediately following ingestion of certain uncooked fruits (including nuts) or raw vegetables. Only a very small number of affected individuals experience systemic allergic reactions, such as anaphylaxis which occurs with true food allergies.    Follow-up in 4-6 months or sooner if needed   I appreciate the opportunity to take part in [place] care. Please do not hesitate to contact me with questions.  Sincerely,   Danita Brain, MD Allergy /Immunology [facility]

## 2024-05-28 NOTE — Patient Instructions (Addendum)
 Inflammatory dermatitis / Hx of atopic dermatitis -Allegra  1-2 times daily -Use long sleeves with possible -Daily moisturization -Mometasone  cream twice a day as needed for eczema flares (itchy, dry, patchy, bumpy, irritated, scaly, flaky skin)  Asthma -Continue Symbicort  2 puffs 2 times daily.   Rinse mouth after use -Use spacer with pump inhalers -Have access to albuterol  inhaler 2 puffs every 4-6 hours as needed for cough/wheeze/shortness of breath/chest tightness.  May use 15-20 minutes prior to activity.   Monitor frequency of use.    Asthma control goals:  Full participation in all desired activities (may need albuterol  before activity) Albuterol  use two time or less a week on average (not counting use with activity) Cough interfering with sleep two time or less a month Oral steroids no more than once a year No hospitalizations   Perennial and Allergic rhinitis - Testing today showed: grasses, ragweed, weeds, trees, indoor molds, outdoor molds, dust mites, cat, and dog - Copy of test results provided.  - Avoidance measures provided. -Allegra  1-2 times daily - You can use an extra dose of the antihistamine, if needed, for breakthrough symptoms.  - Consider allergy  shots as a means of long-term control. - Allergy  shots re-train and reset the immune system to ignore environmental allergens and decrease the resulting immune response to those allergens (sneezing, itchy watery eyes, runny nose, nasal congestion, etc).    - Allergy  shots improve symptoms in 75-85% of patients.  - We can discuss more at a future appointment if the medications are not working for you.  Oral Allergy  Syndrome -Avoid raw fruits, vegetables, and nuts -Testing is positive to carrots and watermelon The oral allergy  syndrome (OAS) or pollen-food allergy  syndrome (PFAS) is a relatively common form of food allergy , particularly in adults. It typically occurs in people who have pollen allergies when the  immune system sees proteins on the food that look like proteins on the pollen. This results in the allergy  antibody (IgE) binding to the food instead of the pollen. Patients typically report itching and/or mild swelling of the mouth and throat immediately following ingestion of certain uncooked fruits (including nuts) or raw vegetables. Only a very small number of affected individuals experience systemic allergic reactions, such as anaphylaxis which occurs with true food allergies.      Follow-up in 4-6 months or sooner if needed

## 2024-10-08 ENCOUNTER — Ambulatory Visit: Admitting: Allergy
